# Patient Record
Sex: Female | Born: 1950 | Race: White | Hispanic: No | Marital: Married | Smoking: Never smoker
Health system: Southern US, Community
[De-identification: ages and names within clinical notes are randomized; demographics above are authoritative.]

## PROBLEM LIST (undated history)

## (undated) DIAGNOSIS — N393 Stress incontinence (female) (male): Secondary | ICD-10-CM

## (undated) DIAGNOSIS — K219 Gastro-esophageal reflux disease without esophagitis: Secondary | ICD-10-CM

## (undated) DIAGNOSIS — M51369 Other intervertebral disc degeneration, lumbar region without mention of lumbar back pain or lower extremity pain: Secondary | ICD-10-CM

## (undated) DIAGNOSIS — M5136 Other intervertebral disc degeneration, lumbar region: Secondary | ICD-10-CM

## (undated) DIAGNOSIS — R102 Pelvic and perineal pain unspecified side: Secondary | ICD-10-CM

## (undated) DIAGNOSIS — J3089 Other allergic rhinitis: Secondary | ICD-10-CM

## (undated) DIAGNOSIS — IMO0002 Reserved for concepts with insufficient information to code with codable children: Secondary | ICD-10-CM

## (undated) DIAGNOSIS — J309 Allergic rhinitis, unspecified: Secondary | ICD-10-CM

---

## 1999-06-20 ENCOUNTER — Ambulatory Visit (HOSPITAL_COMMUNITY): Admission: RE | Admit: 1999-06-20 | Discharge: 1999-06-20 | Payer: Self-pay | Admitting: Gastroenterology

## 1999-11-21 ENCOUNTER — Encounter: Payer: Self-pay | Admitting: Gynecology

## 1999-11-21 ENCOUNTER — Other Ambulatory Visit: Admission: RE | Admit: 1999-11-21 | Discharge: 1999-11-21 | Payer: Self-pay | Admitting: Gynecology

## 1999-11-21 ENCOUNTER — Encounter: Admission: RE | Admit: 1999-11-21 | Discharge: 1999-11-21 | Payer: Self-pay | Admitting: Gynecology

## 2000-05-17 ENCOUNTER — Other Ambulatory Visit: Admission: RE | Admit: 2000-05-17 | Discharge: 2000-05-17 | Payer: Self-pay | Admitting: Gynecology

## 2000-05-17 ENCOUNTER — Encounter (INDEPENDENT_AMBULATORY_CARE_PROVIDER_SITE_OTHER): Payer: Self-pay

## 2000-11-21 ENCOUNTER — Encounter: Payer: Self-pay | Admitting: Gynecology

## 2000-11-21 ENCOUNTER — Other Ambulatory Visit: Admission: RE | Admit: 2000-11-21 | Discharge: 2000-11-21 | Payer: Self-pay | Admitting: Gynecology

## 2000-11-21 ENCOUNTER — Encounter: Admission: RE | Admit: 2000-11-21 | Discharge: 2000-11-21 | Payer: Self-pay | Admitting: Gynecology

## 2000-12-02 ENCOUNTER — Encounter: Payer: Self-pay | Admitting: Gynecology

## 2000-12-02 ENCOUNTER — Encounter: Admission: RE | Admit: 2000-12-02 | Discharge: 2000-12-02 | Payer: Self-pay | Admitting: Gynecology

## 2001-12-31 ENCOUNTER — Other Ambulatory Visit: Admission: RE | Admit: 2001-12-31 | Discharge: 2001-12-31 | Payer: Self-pay | Admitting: Gynecology

## 2002-12-31 ENCOUNTER — Encounter: Payer: Self-pay | Admitting: Gynecology

## 2002-12-31 ENCOUNTER — Encounter: Admission: RE | Admit: 2002-12-31 | Discharge: 2002-12-31 | Payer: Self-pay | Admitting: Gynecology

## 2003-01-05 ENCOUNTER — Other Ambulatory Visit: Admission: RE | Admit: 2003-01-05 | Discharge: 2003-01-05 | Payer: Self-pay | Admitting: Gynecology

## 2004-01-05 ENCOUNTER — Encounter: Admission: RE | Admit: 2004-01-05 | Discharge: 2004-01-05 | Payer: Self-pay | Admitting: Gynecology

## 2004-01-06 ENCOUNTER — Other Ambulatory Visit: Admission: RE | Admit: 2004-01-06 | Discharge: 2004-01-06 | Payer: Self-pay | Admitting: Gynecology

## 2005-01-16 ENCOUNTER — Other Ambulatory Visit: Admission: RE | Admit: 2005-01-16 | Discharge: 2005-01-16 | Payer: Self-pay | Admitting: Gynecology

## 2005-01-30 ENCOUNTER — Encounter: Admission: RE | Admit: 2005-01-30 | Discharge: 2005-01-30 | Payer: Self-pay | Admitting: Gynecology

## 2005-02-07 ENCOUNTER — Encounter: Admission: RE | Admit: 2005-02-07 | Discharge: 2005-02-07 | Payer: Self-pay | Admitting: Gynecology

## 2005-10-04 ENCOUNTER — Ambulatory Visit: Payer: Self-pay | Admitting: Internal Medicine

## 2006-01-21 ENCOUNTER — Other Ambulatory Visit: Admission: RE | Admit: 2006-01-21 | Discharge: 2006-01-21 | Payer: Self-pay | Admitting: Gynecology

## 2006-05-28 ENCOUNTER — Ambulatory Visit: Payer: Self-pay | Admitting: Gastroenterology

## 2006-06-05 ENCOUNTER — Ambulatory Visit: Payer: Self-pay | Admitting: Gastroenterology

## 2006-11-14 ENCOUNTER — Encounter: Admission: RE | Admit: 2006-11-14 | Discharge: 2006-11-14 | Payer: Self-pay | Admitting: Gynecology

## 2007-01-27 ENCOUNTER — Other Ambulatory Visit: Admission: RE | Admit: 2007-01-27 | Discharge: 2007-01-27 | Payer: Self-pay | Admitting: Gynecology

## 2008-02-19 ENCOUNTER — Encounter: Admission: RE | Admit: 2008-02-19 | Discharge: 2008-02-19 | Payer: Self-pay | Admitting: Gynecology

## 2009-01-28 ENCOUNTER — Encounter (INDEPENDENT_AMBULATORY_CARE_PROVIDER_SITE_OTHER): Payer: Self-pay | Admitting: Urology

## 2009-01-28 ENCOUNTER — Ambulatory Visit (HOSPITAL_BASED_OUTPATIENT_CLINIC_OR_DEPARTMENT_OTHER): Admission: RE | Admit: 2009-01-28 | Discharge: 2009-01-28 | Payer: Self-pay | Admitting: Urology

## 2009-01-28 HISTORY — PX: OTHER SURGICAL HISTORY: SHX169

## 2009-04-07 ENCOUNTER — Encounter: Admission: RE | Admit: 2009-04-07 | Discharge: 2009-04-07 | Payer: Self-pay | Admitting: Gynecology

## 2010-06-29 ENCOUNTER — Encounter
Admission: RE | Admit: 2010-06-29 | Discharge: 2010-06-29 | Payer: Self-pay | Source: Home / Self Care | Attending: Gynecology | Admitting: Gynecology

## 2010-07-23 ENCOUNTER — Encounter: Payer: Self-pay | Admitting: Gynecology

## 2010-10-08 LAB — COMPREHENSIVE METABOLIC PANEL
Albumin: 3.5 g/dL (ref 3.5–5.2)
CO2: 29 mEq/L (ref 19–32)
Calcium: 8.7 mg/dL (ref 8.4–10.5)
Glucose, Bld: 99 mg/dL (ref 70–99)
Potassium: 4 mEq/L (ref 3.5–5.1)
Sodium: 140 mEq/L (ref 135–145)
Total Bilirubin: 0.5 mg/dL (ref 0.3–1.2)
Total Protein: 6.7 g/dL (ref 6.0–8.3)

## 2010-10-08 LAB — CBC
HCT: 39.9 % (ref 36.0–46.0)
Hemoglobin: 13.4 g/dL (ref 12.0–15.0)
RDW: 13.3 % (ref 11.5–15.5)
WBC: 5.9 10*3/uL (ref 4.0–10.5)

## 2010-10-08 LAB — DIFFERENTIAL
Basophils Absolute: 0 10*3/uL (ref 0.0–0.1)
Lymphocytes Relative: 26 % (ref 12–46)
Neutrophils Relative %: 61 % (ref 43–77)

## 2010-11-14 NOTE — Op Note (Signed)
[name], [name]             ACCOUNT NO.:  [id number]   MEDICAL RECORD NO.:  [id number]          PATIENT TYPE:  AMB   LOCATION:  [facility]                         FACILITY:  [facility]   PHYSICIAN:  Ronald L. Earlene Plater, M.D.  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   OPERATIVE PROCEDURE:  Cystourethroscopy, urethral dilatation, hydraulic  bladder distention and bladder biopsy.   SURGEON:  Gaynelle Arabian, M.D.   ANESTHESIA:  LMA.   ESTIMATED BLOOD LOSS:  Negligible.   TUBES:  None.   COMPLICATIONS:  None.   FINDINGS:  A 350 mL capacity bladder with urethral leakage at this  point.  80 cm water pressure was not reached and there were post-  distention glomerulations.   INDICATION FOR PROCEDURE:  Ms. Amy Barton is a lovely 60 year old white  female who presents with bladder pain, urgency and frequency.  She has  not been able to tolerate medications and was noted to have a very tight  urethra.  She, after understanding risks, benefits and alternatives,  elected to undergo the above procedure for both therapeutic and  diagnostic reasons.   PROCEDURE IN DETAIL:  The patient is placed in supine position.  After  proper LMA anesthesia, was placed in the dorsal lithotomy position,  prepped and draped with Betadine in a sterile fashion.  Urethra was  calibrated to 30-French with female sounds.  Cystourethroscopy was  performed.  The bladder was carefully inspected and noted to be without  lesions.  Efflux of clear urine was noted from the normally-placed  ureteral orifices bilaterally.  Hydraulic bladder distention was then  performed at a pressure of 80 cm of water.  She had a 350 mL capacity  and sterile water was leaking around the cystoscope at that point,  signifying total capacity, but 80 cm water pressure was never reached.  The  bladder was then drained and reinspection revealed multiple diffuse  glomerulations and some bleeding  points.  Biopsies were taken from the posterior midline with cold cup  biopsy forceps, submitted to pathology, and the base was cauterized with  Bovie coagulation cautery.  Reinspection revealed there were no further  lesions.  Specimens was submitted to pathology.  The patient was taken  to the recovery room, stable.      Ronald L. Earlene Plater, M.D.  Electronically Signed     RLD/MEDQ  D:  01/28/2009  T:  01/28/2009  Job:  [id number]

## 2011-03-19 ENCOUNTER — Other Ambulatory Visit: Payer: Self-pay | Admitting: Gynecology

## 2011-08-07 ENCOUNTER — Other Ambulatory Visit: Payer: Self-pay | Admitting: Gynecology

## 2011-08-07 DIAGNOSIS — Z1231 Encounter for screening mammogram for malignant neoplasm of breast: Secondary | ICD-10-CM

## 2011-08-23 ENCOUNTER — Ambulatory Visit
Admission: RE | Admit: 2011-08-23 | Discharge: 2011-08-23 | Disposition: A | Discharge status: Home / Self Care | Payer: 59 | Source: Ambulatory Visit | Attending: Gynecology | Admitting: Gynecology

## 2011-08-23 DIAGNOSIS — Z1231 Encounter for screening mammogram for malignant neoplasm of breast: Secondary | ICD-10-CM

## 2012-02-22 ENCOUNTER — Ambulatory Visit: Payer: Self-pay | Admitting: Gastroenterology

## 2012-08-05 ENCOUNTER — Other Ambulatory Visit: Payer: Self-pay | Admitting: Gynecology

## 2012-10-08 ENCOUNTER — Other Ambulatory Visit: Payer: Self-pay

## 2012-10-08 DIAGNOSIS — Z1231 Encounter for screening mammogram for malignant neoplasm of breast: Secondary | ICD-10-CM

## 2012-11-11 ENCOUNTER — Ambulatory Visit
Admission: RE | Admit: 2012-11-11 | Discharge: 2012-11-11 | Disposition: A | Discharge status: Home / Self Care | Payer: 59 | Source: Ambulatory Visit

## 2012-11-11 DIAGNOSIS — Z1231 Encounter for screening mammogram for malignant neoplasm of breast: Secondary | ICD-10-CM

## 2013-05-22 ENCOUNTER — Emergency Department: Payer: Self-pay | Admitting: Emergency Medicine

## 2013-05-22 LAB — CBC
HGB: 13.3 g/dL (ref 12.0–16.0)
MCV: 84 fL (ref 80–100)
RBC: 4.76 10*6/uL (ref 3.80–5.20)
RDW: 13.7 % (ref 11.5–14.5)

## 2013-05-22 LAB — BASIC METABOLIC PANEL
BUN: 15 mg/dL (ref 7–18)
Chloride: 107 mmol/L (ref 98–107)
Co2: 28 mmol/L (ref 21–32)
Creatinine: 0.83 mg/dL (ref 0.60–1.30)
EGFR (African American): >60
Glucose: 113 mg/dL — ABNORMAL HIGH (ref 65–99)
Osmolality: 281 (ref 275–301)
Potassium: 3.5 mmol/L (ref 3.5–5.1)

## 2013-05-22 LAB — TROPONIN I: Troponin-I: <0.02 ng/mL

## 2013-06-15 ENCOUNTER — Ambulatory Visit: Payer: Self-pay | Admitting: Gastroenterology

## 2013-06-16 LAB — PATHOLOGY REPORT

## 2014-11-25 ENCOUNTER — Other Ambulatory Visit: Payer: Self-pay | Admitting: Urology

## 2014-12-02 ENCOUNTER — Encounter (HOSPITAL_BASED_OUTPATIENT_CLINIC_OR_DEPARTMENT_OTHER): Payer: Self-pay | Admitting: *Deleted

## 2014-12-02 NOTE — Progress Notes (Signed)
NPO AFTER MN.  ARRIVE AT 0600.  NEEDS HG.  WILL TAKE PROTONIX AM DOS W/ SIPS OF WATER.

## 2014-12-08 NOTE — H&P (Signed)
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 She was switched on VESIcare to Toviaz for urgency and frequency last time because of cost. She has responded to rescue treatments in the past.     I saw Amy Barton in May, and I renewed her Elmiron, Toviaz, and Uribel. The Gala Murdochoviaz did not work as well as Assurantthe VESIcare, but it was a Futures traderfinancial decision. She saw Diane 3 times since and had a rescue treatment on 10/20/2014 with no significant benefit.   A staff tried to pass a catheter but she had too much pain and the meatus was strictured. Diane attempted a 14-French straight catheter and was able to insert it with a lot of resistance, and she had another cocktail treatment. Diane did not think she could tolerate an office dilation.   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 The urethra was calibrated to 30-French, and she was hydrodistended to 350 mL. She had multiple diffuse glomerulations with some bleeding.   Review of Systems: No change of bowel or neurologic systems.   Urinalysis: I reviewed. Negative.  Frequency is stable.    Past Medical History Problems  1. History of chronic cystitis (Z87.448) 2. History of depression (Z86.59) 3. History of esophageal reflux (Z87.19)  Surgical History Problems  1. History of Cesarean Section 2. History of Cystoscopy With Biopsy 3. History of Cystoscopy With Dilation Of Bladder  Current Meds 1. ALPRAZolam 0.25 MG Oral Tablet;  Therapy: 12Jul2012 to Recorded 2. Ambien CR 12.5 MG Oral Tablet Extended Release;  Therapy: 28Jun2011 to Recorded 3. Benadryl TABS;  Therapy: (Recorded:03Aug2011) to Recorded 4. CeleBREX 200 MG Oral Capsule;  Therapy: 19Apr2012 to Recorded 5. Citalopram Hydrobromide 20 MG Oral Tablet;  Therapy: 12Dec2014 to Recorded 6. Dexilant 60 MG Oral Capsule Delayed Release;  Therapy: 12Dec2014 to Recorded 7. Elmiron 100 MG Oral Capsule;  TAKE 1 CAPSULE 3 times daily;  Therapy: 26Mar2010 to (Last Rx:01Jul2015)  Requested for: 03Jul2015 Ordered 8. Estrace 0.1 MG/GM Vaginal Cream;  Therapy: (Recorded:03Aug2011) to Recorded 9. Flonase 50 MCG/ACT Nasal Suspension;  Therapy: (Recorded:05Dec2008) to Recorded 10. Singulair 10 MG Oral Tablet;   Therapy: (Recorded:26Dec2012) to Recorded 11. SUMAtriptan Succinate 100 MG Oral Tablet;   Therapy: (Recorded:08May2013) to Recorded 12. Toviaz 4 MG Oral Tablet Extended Release 24 Hour; Take 1 tablet daily;   Therapy: 11May2015 to (Last Rx:11May2015)  Requested for: 11May2015 Ordered 13. Uribel 118 MG Oral Capsule; TAKE 1 CAPSULE 4 times daily PRN;   Therapy: 19Feb2014 to (Last Rx:07Apr2016)  Requested for: 11Apr2016 Ordered 14. Vitamin D TABS;   Therapy: (Recorded:03Aug2011) to Recorded 15. Vivelle 0.05 MG/24HR PTTW;   Therapy: (Recorded:03Aug2011) to Recorded  Allergies Medication  1. Claritin TABS 2. Claritin-D  Family History Problems  1. Family history of Family Health Status Number Of Children   2 sons 2. Family history of Renal Failure : Father 3. Family history of Renal Failure : Brother  Social History Problems  1. Activities Of Daily Living 2. Alcohol Use 3. Being A Social Drinker 4. Caffeine Use   1-2 drinks daily 5. Exercise Habits   walking the dog and no other reg exer. 6. Living Independently With Spouse 7. Marital History - Currently Married 8. Never A Smoker 9. Occupation:   Airline pilotaccountant 10. Self-reliant In Usual Daily Activities  Vitals Vital Signs [Data Includes: Last 1 Day]  Recorded: 24May2016 09:24AM  Blood Pressure: 159 / 100 Temperature: 98.5 F Heart  Rate: 97  Results/Data  Urine [Data Includes: Last 1 Day]   24May2016  COLOR YELLOW   APPEARANCE CLEAR   SPECIFIC GRAVITY <1.005   pH 5.5   GLUCOSE NEG mg/dL  BILIRUBIN NEG   KETONE NEG mg/dL  BLOOD NEG   PROTEIN NEG mg/dL  UROBILINOGEN 0.2 mg/dL  NITRITE NEG   LEUKOCYTE ESTERASE  NEG    Assessment Assessed  1. Increased urinary frequency (R35.0) 2. Chronic interstitial cystitis without hematuria (N30.10)  Plan Chronic interstitial cystitis without hematuria  1. Follow-up Schedule Surgery Office  Follow-up  Status: Complete  Done: 24May2016  Discussion/Summary   Amy Fodera had a hard time keeping the last instillation from when she stood up. She thinks it has helped some, but she still has some suprapubic discomfort. She thinks this flare is from the springtime, and she may be right.  I talked to her about her hydrodistention and urethral dilation and its pros and cons and risks.   We talked about cystoscopy/hydrodistention and instillation in detail. Pros, cons, general surgical and anesthetic risks, and other options including watchful waiting were discussed. Risks were described but not limited to pain, infection, and bleeding. The risk of bladder perforation and management were discussed. The patient understands that it is primarily a diagnostic procedure.   She understands that she does not need the procedure for certain, but we are hoping it would help in the catheterization process in the future for rescue treatments. I would also evaluate her strictly for urethral stenosis under anesthesia. She consented.   In regard to the symptoms, there are no other modifying factors or associated signs or symptoms. There are no other aggravating or relieving factors. The presentation is moderate in severity and ongoing.  After a thorough review of the management options for the patient's condition the patient  elected to proceed with surgical therapy as noted above. We have discussed the potential benefits and risks of the procedure, side effects of the proposed treatment, the likelihood of the patient achieving the goals of the procedure, and any potential problems that might occur during the procedure or recuperation. Informed consent has been obtained.

## 2014-12-08 NOTE — Anesthesia Preprocedure Evaluation (Signed)
Anesthesia Evaluation  Patient identified by MRN, date of birth, ID band Patient awake    Reviewed: Allergy & Precautions, NPO status , Patient's Chart, lab work & pertinent test results  Airway Mallampati: II  TM Distance: >3 FB Neck ROM: Full    Dental no notable dental hx.    Pulmonary neg pulmonary ROS,  breath sounds clear to auscultation  Pulmonary exam normal       Cardiovascular negative cardio ROS Normal cardiovascular examRhythm:Regular Rate:Normal     Neuro/Psych negative neurological ROS  negative psych ROS   GI/Hepatic negative GI ROS, Neg liver ROS, GERD-  ,  Endo/Other  negative endocrine ROS  Renal/GU negative Renal ROS     Musculoskeletal negative musculoskeletal ROS (+) Arthritis -,   Abdominal   Peds  Hematology negative hematology ROS (+)   Anesthesia Other Findings   Reproductive/Obstetrics negative OB ROS                             Anesthesia Physical Anesthesia Plan  ASA: II  Anesthesia Plan: General   Post-op Pain Management:    Induction: Intravenous  Airway Management Planned: LMA  Additional Equipment:   Intra-op Plan:   Post-operative Plan: Extubation in OR  Informed Consent: I have reviewed the patients History and Physical, chart, labs and discussed the procedure including the risks, benefits and alternatives for the proposed anesthesia with the patient or authorized representative who has indicated his/her understanding and acceptance.   Dental advisory given  Plan Discussed with: CRNA  Anesthesia Plan Comments:         Anesthesia Quick Evaluation

## 2014-12-09 ENCOUNTER — Ambulatory Visit (HOSPITAL_BASED_OUTPATIENT_CLINIC_OR_DEPARTMENT_OTHER): Payer: 59 | Admitting: Anesthesiology

## 2014-12-09 ENCOUNTER — Encounter (HOSPITAL_BASED_OUTPATIENT_CLINIC_OR_DEPARTMENT_OTHER)
Admission: RE | Disposition: A | Discharge status: Home / Self Care | Payer: Self-pay | Source: Ambulatory Visit | Attending: Urology

## 2014-12-09 ENCOUNTER — Other Ambulatory Visit: Payer: Self-pay

## 2014-12-09 ENCOUNTER — Ambulatory Visit (HOSPITAL_BASED_OUTPATIENT_CLINIC_OR_DEPARTMENT_OTHER)
Admission: RE | Admit: 2014-12-09 | Discharge: 2014-12-09 | Disposition: A | Discharge status: Home / Self Care | Payer: 59 | Source: Ambulatory Visit | Attending: Urology | Admitting: Urology

## 2014-12-09 ENCOUNTER — Encounter (HOSPITAL_BASED_OUTPATIENT_CLINIC_OR_DEPARTMENT_OTHER): Payer: Self-pay | Admitting: *Deleted

## 2014-12-09 DIAGNOSIS — N3010 Interstitial cystitis (chronic) without hematuria: Secondary | ICD-10-CM | POA: Insufficient documentation

## 2014-12-09 DIAGNOSIS — Z79890 Hormone replacement therapy (postmenopausal): Secondary | ICD-10-CM | POA: Diagnosis not present

## 2014-12-09 DIAGNOSIS — K219 Gastro-esophageal reflux disease without esophagitis: Secondary | ICD-10-CM | POA: Diagnosis not present

## 2014-12-09 DIAGNOSIS — F329 Major depressive disorder, single episode, unspecified: Secondary | ICD-10-CM | POA: Insufficient documentation

## 2014-12-09 DIAGNOSIS — Z79899 Other long term (current) drug therapy: Secondary | ICD-10-CM | POA: Diagnosis not present

## 2014-12-09 DIAGNOSIS — Z791 Long term (current) use of non-steroidal anti-inflammatories (NSAID): Secondary | ICD-10-CM | POA: Diagnosis not present

## 2014-12-09 DIAGNOSIS — N359 Urethral stricture, unspecified: Secondary | ICD-10-CM | POA: Diagnosis not present

## 2014-12-09 DIAGNOSIS — R102 Pelvic and perineal pain: Secondary | ICD-10-CM | POA: Diagnosis present

## 2014-12-09 DIAGNOSIS — M1990 Unspecified osteoarthritis, unspecified site: Secondary | ICD-10-CM | POA: Insufficient documentation

## 2014-12-09 HISTORY — PX: CYSTO WITH HYDRODISTENSION: SHX5453

## 2014-12-09 HISTORY — DX: Gastro-esophageal reflux disease without esophagitis: K21.9

## 2014-12-09 HISTORY — PX: CYSTOSCOPY WITH URETHRAL DILATATION: SHX5125

## 2014-12-09 HISTORY — DX: Pelvic and perineal pain: R10.2

## 2014-12-09 HISTORY — DX: Pelvic and perineal pain unspecified side: R10.20

## 2014-12-09 HISTORY — DX: Other intervertebral disc degeneration, lumbar region without mention of lumbar back pain or lower extremity pain: M51.369

## 2014-12-09 HISTORY — DX: Other intervertebral disc degeneration, lumbar region: M51.36

## 2014-12-09 HISTORY — DX: Reserved for concepts with insufficient information to code with codable children: IMO0002

## 2014-12-09 HISTORY — DX: Other allergic rhinitis: J30.89

## 2014-12-09 HISTORY — DX: Allergic rhinitis, unspecified: J30.9

## 2014-12-09 HISTORY — DX: Stress incontinence (female) (male): N39.3

## 2014-12-09 LAB — POCT HEMOGLOBIN-HEMACUE: HEMOGLOBIN: 13.3 g/dL (ref 12.0–15.0)

## 2014-12-09 SURGERY — CYSTOSCOPY, WITH BLADDER HYDRODISTENSION
Anesthesia: General | Site: Urethra

## 2014-12-09 MED ORDER — FENTANYL CITRATE (PF) 100 MCG/2ML IJ SOLN
INTRAMUSCULAR | Status: DC | PRN
  Administered 2014-12-09: 25 ug via INTRAVENOUS

## 2014-12-09 MED ORDER — CIPROFLOXACIN IN D5W 400 MG/200ML IV SOLN
INTRAVENOUS | Status: AC
  Filled 2014-12-09: qty 200

## 2014-12-09 MED ORDER — MIDAZOLAM HCL 2 MG/2ML IJ SOLN
INTRAMUSCULAR | Status: AC
  Filled 2014-12-09: qty 2

## 2014-12-09 MED ORDER — HYDROMORPHONE HCL 1 MG/ML IJ SOLN
0.2500 mg | INTRAMUSCULAR | Status: DC | PRN
  Filled 2014-12-09: qty 1

## 2014-12-09 MED ORDER — BUPIVACAINE HCL (PF) 0.5 % IJ SOLN
INTRAMUSCULAR | Status: DC | PRN
  Administered 2014-12-09: 15 mL via INTRAVESICAL

## 2014-12-09 MED ORDER — CIPROFLOXACIN IN D5W 400 MG/200ML IV SOLN
400.0000 mg | INTRAVENOUS | Status: DC
  Filled 2014-12-09: qty 200

## 2014-12-09 MED ORDER — LIDOCAINE HCL (CARDIAC) 20 MG/ML IV SOLN
INTRAVENOUS | Status: DC | PRN
  Administered 2014-12-09: 50 mg via INTRAVENOUS

## 2014-12-09 MED ORDER — LACTATED RINGERS IV SOLN
INTRAVENOUS | Status: DC
  Administered 2014-12-09: 07:00:00 via INTRAVENOUS
  Filled 2014-12-09: qty 1000

## 2014-12-09 MED ORDER — OXYCODONE HCL 5 MG/5ML PO SOLN
5.0000 mg | Freq: Once | ORAL | Status: DC | PRN
  Filled 2014-12-09: qty 5

## 2014-12-09 MED ORDER — DEXAMETHASONE SODIUM PHOSPHATE 4 MG/ML IJ SOLN
INTRAMUSCULAR | Status: DC | PRN
  Administered 2014-12-09: 10 mg via INTRAVENOUS

## 2014-12-09 MED ORDER — STERILE WATER FOR IRRIGATION IR SOLN
Status: DC | PRN
  Administered 2014-12-09: 3000 mL

## 2014-12-09 MED ORDER — FENTANYL CITRATE (PF) 100 MCG/2ML IJ SOLN
INTRAMUSCULAR | Status: AC
  Filled 2014-12-09: qty 2

## 2014-12-09 MED ORDER — MEPERIDINE HCL 25 MG/ML IJ SOLN
6.2500 mg | INTRAMUSCULAR | Status: DC | PRN
  Filled 2014-12-09: qty 1

## 2014-12-09 MED ORDER — HYDROCODONE-ACETAMINOPHEN 5-325 MG PO TABS: 1.0000 | ORAL_TABLET | Freq: Four times a day (QID) | ORAL | Status: DC | PRN

## 2014-12-09 MED ORDER — ONDANSETRON HCL 4 MG/2ML IJ SOLN
INTRAMUSCULAR | Status: DC | PRN
  Administered 2014-12-09: 4 mg via INTRAVENOUS

## 2014-12-09 MED ORDER — PROMETHAZINE HCL 25 MG/ML IJ SOLN
6.2500 mg | INTRAMUSCULAR | Status: DC | PRN
  Filled 2014-12-09: qty 1

## 2014-12-09 MED ORDER — MIDAZOLAM HCL 5 MG/5ML IJ SOLN
INTRAMUSCULAR | Status: DC | PRN
  Administered 2014-12-09: 1 mg via INTRAVENOUS

## 2014-12-09 MED ORDER — OXYCODONE HCL 5 MG PO TABS
5.0000 mg | ORAL_TABLET | Freq: Once | ORAL | Status: DC | PRN
  Filled 2014-12-09: qty 1

## 2014-12-09 MED ORDER — PROPOFOL 10 MG/ML IV BOLUS
INTRAVENOUS | Status: DC | PRN
  Administered 2014-12-09: 150 mg via INTRAVENOUS

## 2014-12-09 MED ORDER — CIPROFLOXACIN HCL 250 MG PO TABS: 250.0000 mg | ORAL_TABLET | Freq: Two times a day (BID) | ORAL | Status: DC

## 2014-12-09 MED ORDER — KETOROLAC TROMETHAMINE 30 MG/ML IJ SOLN
INTRAMUSCULAR | Status: DC | PRN
  Administered 2014-12-09: 30 mg via INTRAVENOUS

## 2014-12-09 SURGICAL SUPPLY — 38 items
ADAPTER CATH URET PLST 4-6FR (CATHETERS) IMPLANT
ADPR CATH URET STRL DISP 4-6FR (CATHETERS)
BAG DRAIN URO-CYSTO SKYTR STRL (DRAIN) ×3 IMPLANT
BAG DRN UROCATH (DRAIN) ×1
BALLN NEPHROSTOMY (BALLOONS)
BALLOON NEPHROSTOMY (BALLOONS) IMPLANT
CANISTER SUCT LVC 12 LTR MEDI- (MISCELLANEOUS) ×2 IMPLANT
CATH FOLEY 2WAY SLVR  5CC 18FR (CATHETERS)
CATH FOLEY 2WAY SLVR 5CC 18FR (CATHETERS) IMPLANT
CATH ROBINSON RED A/P 12FR (CATHETERS) ×2 IMPLANT
CATH ROBINSON RED A/P 14FR (CATHETERS) ×1 IMPLANT
CATH URET 5FR 28IN CONE TIP (BALLOONS)
CATH URET 5FR 70CM CONE TIP (BALLOONS) IMPLANT
CLOTH BEACON ORANGE TIMEOUT ST (SAFETY) ×3 IMPLANT
ELECT REM PT RETURN 9FT ADLT (ELECTROSURGICAL)
ELECTRODE REM PT RTRN 9FT ADLT (ELECTROSURGICAL) IMPLANT
GLOVE BIO SURGEON STRL SZ 6.5 (GLOVE) ×1 IMPLANT
GLOVE BIO SURGEON STRL SZ7.5 (GLOVE) ×3 IMPLANT
GLOVE BIO SURGEONS STRL SZ 6.5 (GLOVE) ×1
GLOVE INDICATOR 6.5 STRL GRN (GLOVE) ×4 IMPLANT
GLOVE INDICATOR 7.0 STRL GRN (GLOVE) ×2 IMPLANT
GLOVE SURG SS PI 6.5 STRL IVOR (GLOVE) ×2 IMPLANT
GOWN STRL REUS W/ TWL LRG LVL3 (GOWN DISPOSABLE) ×1 IMPLANT
GOWN STRL REUS W/ TWL XL LVL3 (GOWN DISPOSABLE) ×1 IMPLANT
GOWN STRL REUS W/TWL LRG LVL3 (GOWN DISPOSABLE) ×9
GOWN STRL REUS W/TWL XL LVL3 (GOWN DISPOSABLE) ×3
GUIDEWIRE STR DUAL SENSOR (WIRE) ×1 IMPLANT
NDL HYPO 18GX1.5 BLUNT FILL (NEEDLE) IMPLANT
NDL SAFETY ECLIPSE 18X1.5 (NEEDLE) ×1 IMPLANT
NEEDLE HYPO 18GX1.5 BLUNT FILL (NEEDLE) ×3 IMPLANT
NEEDLE HYPO 18GX1.5 SHARP (NEEDLE) ×3
NS IRRIG 500ML POUR BTL (IV SOLUTION) IMPLANT
PACK CYSTO (CUSTOM PROCEDURE TRAY) ×3 IMPLANT
SUT SILK 0 TIES 10X30 (SUTURE) IMPLANT
SYR 20CC LL (SYRINGE) ×1 IMPLANT
SYR 30ML LL (SYRINGE) ×2 IMPLANT
SYRINGE IRR TOOMEY STRL 70CC (SYRINGE) IMPLANT
WATER STERILE IRR 3000ML UROMA (IV SOLUTION) ×3 IMPLANT

## 2014-12-09 NOTE — Transfer of Care (Addendum)
Immediate Anesthesia Transfer of Care Note  Patient: Amy Barton  Procedure(s) Performed: Procedure(s): CYSTO/HYDRODISTENSION/INSTALLATION (N/A) CYSTOSCOPY WITH URETHRAL DILATATION (N/A)  Patient Location: PACU  Anesthesia Type:General  Level of Consciousness: awake and oriented  Airway & Oxygen Therapy: Patient Spontanous Breathing and Patient connected to nasal cannula oxygen  Post-op Assessment: Report given to RN  Post vital signs: Reviewed and stable  Last Vitals:  Filed Vitals:   12/09/14 0631  BP: 141/83  Pulse: 71  Temp: 36.9 C  Resp: 14    Complications: adverse drug reaction, upon arrival to OR noted rash on back.  Cipro was infusing but pt stated that she has the rash on and off on back.  Cipro continued.  At end of cases noted rash on trunk and extremities, not on face.  Could be from Cipro or from chlorahexidine wipe.  Denies itching.

## 2014-12-09 NOTE — OR Nursing (Signed)
Red rubber cath#14 in and out for the pyridium mix by Dr. Sherron Monday. Patient with redness all over her body at the end of the procedure.

## 2014-12-09 NOTE — Interval H&P Note (Signed)
History and Physical Interval Note:  12/09/2014 6:53 AM  Amy Barton  has presented today for surgery, with the diagnosis of PELVIC PAIN/URETHRAL STENOSIS  The various methods of treatment have been discussed with the patient and family. After consideration of risks, benefits and other options for treatment, the patient has consented to  Procedure(s): CYSTO/HYDRODISTENSION/INSTALLATION (N/A) CYSTOSCOPY WITH URETHRAL DILATATION (N/A) as a surgical intervention .  The patient's history has been reviewed, patient examined, no change in status, stable for surgery.  I have reviewed the patient's chart and labs.  Questions were answered to the patient's satisfaction.     [name]

## 2014-12-09 NOTE — Anesthesia Procedure Notes (Signed)
Procedure Name: LMA Insertion Date/Time: 12/09/2014 7:31 AM Performed by: Maris Berger T Pre-anesthesia Checklist: Patient identified, Emergency Drugs available, Suction available and Patient being monitored Patient Re-evaluated:Patient Re-evaluated prior to inductionOxygen Delivery Method: Circle System Utilized Preoxygenation: Pre-oxygenation with 100% oxygen Intubation Type: IV induction Ventilation: Mask ventilation without difficulty LMA: LMA inserted LMA Size: 4.0 Number of attempts: 1 Airway Equipment and Method: Bite block Placement Confirmation: positive ETCO2 Dental Injury: Teeth and Oropharynx as per pre-operative assessment

## 2014-12-09 NOTE — Anesthesia Postprocedure Evaluation (Signed)
Anesthesia Post Note  Patient: Amy Barton  Procedure(s) Performed: Procedure(s) (LRB): CYSTO/HYDRODISTENSION/INSTALLATION (N/A) CYSTOSCOPY WITH URETHRAL DILATATION (N/A)  Anesthesia type: General  Patient location: PACU  Post pain: Pain level controlled  Post assessment: Post-op Vital signs reviewed  Last Vitals: BP 145/92 mmHg  Pulse 68  Temp(Src) 36.4 C (Oral)  Resp 14  Ht 5\' 2"  (1.575 m)  Wt 119 lb (53.978 kg)  BMI 21.76 kg/m2  SpO2 100%  Post vital signs: Reviewed  Level of consciousness: sedated  Complications: No apparent anesthesia complications

## 2014-12-09 NOTE — Op Note (Signed)
Preoperative diagnosis: Interstitial cystitis and urethral stricture Postoperative diagnosis: Interstitial cystitis and urethral stricture Surgery: Cystoscopy and bladder hydrodistention and bladder installation therapy and urethral dilation Surgeon: Dr. Lorin Picket Thressa Shiffer  The patient consented the above procedure with the above diagnoses. Preoperative antibodies were given  I initially used a 35 [place] scope and she did have a stricture approximately 1 cm proximal to the urethral meatus or closer to the meatus. With a little bit a negotiation I was able to push through the stricture easily.  She was hydrodistended to 380 mL twice. Her bladder was emptied. She diffuse glomerulations and no ulcers. Trigone was normal.  I reinspected the urethra and she had typical findings in keeping with the dilator stricture distally and she may have had some scarring at the bladder neck. Urethra was reasonably long and quite supple. Using female sounds it was easily dilated from 17-32 [place]. The urethra itself was not stenotic except for the 1 or 2 webs dilated in my opinion  As a separate procedure I instilled 25 mL of 0.5% Marcaine and 400 mg of peridium  Patient will be treated as per protocol

## 2014-12-09 NOTE — Discharge Instructions (Signed)
I have reviewed discharge instructions in detail with the patient. They will follow-up with me or their physician as scheduled. My nurse will also be calling the patients as per protocol.    CYSTOSCOPY HOME CARE INSTRUCTIONS  Activity: Rest for the remainder of the day.  Do not drive or operate equipment today.  You may resume normal activities in one to two days as instructed by your physician.   Meals: Drink plenty of liquids and eat light foods such as gelatin or soup this evening.  You may return to a normal meal plan tomorrow.  Return to Work: You may return to work in one to two days or as instructed by your physician.  Special Instructions / Symptoms: Call your physician if any of these symptoms occur:   -persistent or heavy bleeding  -bleeding which continues after first few urination  -large blood clots that are difficult to pass  -urine stream diminishes or stops completely  -fever equal to or higher than 101 degrees Farenheit.  -cloudy urine with a strong, foul odor  -severe pain  Females should always wipe from front to back after elimination.  You may feel some burning pain when you urinate.  This should disappear with time.  Applying moist heat to the lower abdomen or a hot tub bath may help relieve the pain. \     Post Anesthesia Home Care Instructions  Activity: Get plenty of rest for the remainder of the day. A responsible adult should stay with you for 24 hours following the procedure.  For the next 24 hours, DO NOT: -Drive a car -Advertising copywriter -Drink alcoholic beverages -Take any medication unless instructed by your physician -Make any legal decisions or sign important papers.  Meals: Start with liquid foods such as gelatin or soup. Progress to regular foods as tolerated. Avoid greasy, spicy, heavy foods. If nausea and/or vomiting occur, drink only clear liquids until the nausea and/or vomiting subsides. Call your physician if vomiting  continues.  Special Instructions/Symptoms: Your throat may feel dry or sore from the anesthesia or the breathing tube placed in your throat during surgery. If this causes discomfort, gargle with warm salt water. The discomfort should disappear within 24 hours.  If you had a scopolamine patch placed behind your ear for the management of post- operative nausea and/or vomiting:  1. The medication in the patch is effective for 72 hours, after which it should be removed.  Wrap patch in a tissue and discard in the trash. Wash hands thoroughly with soap and water. 2. You may remove the patch earlier than 72 hours if you experience unpleasant side effects which may include dry mouth, dizziness or visual disturbances. 3. Avoid touching the patch. Wash your hands with soap and water after contact with the patch.

## 2014-12-10 ENCOUNTER — Encounter (HOSPITAL_BASED_OUTPATIENT_CLINIC_OR_DEPARTMENT_OTHER): Payer: Self-pay | Admitting: Urology

## 2014-12-10 NOTE — Progress Notes (Signed)
MS,Sarris CALLED STATING THAT HER FACE HAS BROKEN OUT.  SHE WAS INSTRUCTED TO CONTACT DR.MACDIARMID AT THE OFFICE AND IF HE WAS NOT THERE ASK TO SPEAK WITH HIS NURSE.  IF UNABLE TO REACH EITHER ASK TO SPEAK WITH THE ON CALL DOCTOR.

## 2015-07-07 ENCOUNTER — Ambulatory Visit (INDEPENDENT_AMBULATORY_CARE_PROVIDER_SITE_OTHER): Payer: Medicare Other | Admitting: Pediatrics

## 2015-07-07 ENCOUNTER — Encounter: Payer: Self-pay | Admitting: Pediatrics

## 2015-07-07 VITALS — BP 118/76 | HR 78 | Temp 98.0°F | Resp 16 | Ht 60.63 in | Wt 119.9 lb

## 2015-07-07 DIAGNOSIS — K219 Gastro-esophageal reflux disease without esophagitis: Secondary | ICD-10-CM

## 2015-07-07 DIAGNOSIS — J3089 Other allergic rhinitis: Secondary | ICD-10-CM

## 2015-07-07 DIAGNOSIS — J4520 Mild intermittent asthma, uncomplicated: Secondary | ICD-10-CM | POA: Diagnosis not present

## 2015-07-07 MED ORDER — FLUTICASONE PROPIONATE 50 MCG/ACT NA SUSP: 1.0000 | Freq: Two times a day (BID) | NASAL | Status: AC

## 2015-07-07 NOTE — Patient Instructions (Signed)
Cetirizine 10 mg once a day Fluticasone 2 sprays per nostril once a day if needed for stuffy nose Ventolin 2 puffs every 4 hours if needed for wheezing or coughing spells Continue other medications

## 2015-07-07 NOTE — Progress Notes (Signed)
  [address] [place] [address] Dept: [phone]  FOLLOW UP NOTE  Patient ID: Amy Barton, female    DOB: 08/09/1950  Age: 65 y.o. MRN: [id number] Date of Office Visit: 07/07/2015  Assessment Chief Complaint: Allergic Rhinitis   HPI Amy Clevelandancy A Padmore presents for follow-up of her asthma and allergic rhinitis. Her asthma has been well controlled and she very rarely has to use  Ventolin Her nasal symptoms are under control.  Current medications are cetirizine 10 mg once a day if needed and fluticasone 2 sprays per nostril once a day if needed and pantoprazole 40 mg once a day. Her other medications are outlined in the chart   Drug Allergies:  Allergies  Allergen Reactions  . Allegra-D  [Fexofenadine-Pseudoephed Er] Nausea And Vomiting  . Claritin-D 12 Hour [Loratadine-Pseudoephedrine Er] Other (See Comments)    "DIZZINESS"  . Chlorhexidine Gluconate Rash    Rash after using wipes pre operatively     Physical Exam: BP 118/76 mmHg  Pulse 78  Temp(Src) 98 F (36.7 C) (Oral)  Resp 16  Ht 5' 0.63" (1.54 m)  Wt 119 lb 14.9 oz (54.4 kg)  BMI 22.94 kg/m2   Physical Exam  Constitutional: She is oriented to person, place, and time. She appears well-developed and well-nourished.  HENT:  Eyes normal. Ears normal. Nose normal. Pharynx normal.  Neck: Neck supple.  Cardiovascular:  S1 and S2 normal no murmurs  Pulmonary/Chest:  Clear to percussion and auscultation  Lymphadenopathy:    She has no cervical adenopathy.  Neurological: She is alert and oriented to person, place, and time.  Psychiatric: She has a normal mood and affect. Her behavior is normal. Judgment and thought content normal.  Vitals reviewed.   Diagnostics:   FVC 2.93 L FEV1 2.14 L. Predicted FVC 2.80 L predicted FEV1 2.14 L-spirometry in the normal range  Assessment and Plan: 1. Mild intermittent asthma, uncomplicated   2. Other allergic rhinitis   3. Gastroesophageal reflux disease  without esophagitis     Meds ordered this encounter  Medications  . fluticasone (FLONASE) 50 MCG/ACT nasal spray    Sig: Place 1 spray into both nostrils 2 (two) times daily.    Dispense:  16 g    Refill:  5    Patient Instructions  Cetirizine 10 mg once a day Fluticasone 2 sprays per nostril once a day if needed for stuffy nose Ventolin 2 puffs every 4 hours if needed for wheezing or coughing spells Continue other medications    Return in about 1 year (around 07/06/2016).    Thank you for the opportunity to care for this patient.  Please do not hesitate to contact me with questions.  Tonette BihariJ. A. Bardelas, M.D.  [facility] [address] [place], [place] [address] [phone]

## 2015-12-01 ENCOUNTER — Other Ambulatory Visit: Payer: Self-pay | Admitting: Gynecology

## 2015-12-01 DIAGNOSIS — R928 Other abnormal and inconclusive findings on diagnostic imaging of breast: Secondary | ICD-10-CM

## 2015-12-07 ENCOUNTER — Ambulatory Visit
Admission: RE | Admit: 2015-12-07 | Discharge: 2015-12-07 | Disposition: A | Discharge status: Home / Self Care | Payer: Medicare Other | Source: Ambulatory Visit | Attending: Gynecology | Admitting: Gynecology

## 2015-12-07 DIAGNOSIS — R928 Other abnormal and inconclusive findings on diagnostic imaging of breast: Secondary | ICD-10-CM

## 2016-04-24 ENCOUNTER — Other Ambulatory Visit: Payer: Self-pay | Admitting: Gastroenterology

## 2016-04-24 DIAGNOSIS — R1013 Epigastric pain: Secondary | ICD-10-CM

## 2016-05-01 ENCOUNTER — Ambulatory Visit: Payer: Medicare Other

## 2016-05-08 ENCOUNTER — Ambulatory Visit
Admission: RE | Admit: 2016-05-08 | Discharge: 2016-05-08 | Disposition: A | Discharge status: Home / Self Care | Payer: Medicare Other | Source: Ambulatory Visit | Attending: Gastroenterology | Admitting: Gastroenterology

## 2016-05-08 DIAGNOSIS — R1013 Epigastric pain: Secondary | ICD-10-CM | POA: Insufficient documentation

## 2017-03-02 IMAGING — MG 2D DIGITAL DIAGNOSTIC UNILATERAL RIGHT MAMMOGRAM WITH CAD AND AD
5 series · 6 of 13 positions shown · non-contrast
Comparison: Previous exam(s).

CLINICAL DATA: Screening recall for a possible right breast mass.

EXAM:
2D DIGITAL DIAGNOSTIC UNILATERAL RIGHT MAMMOGRAM WITH CAD AND
ADJUNCT TOMO
RIGHT BREAST ULTRASOUND

[R ML synth-2D]
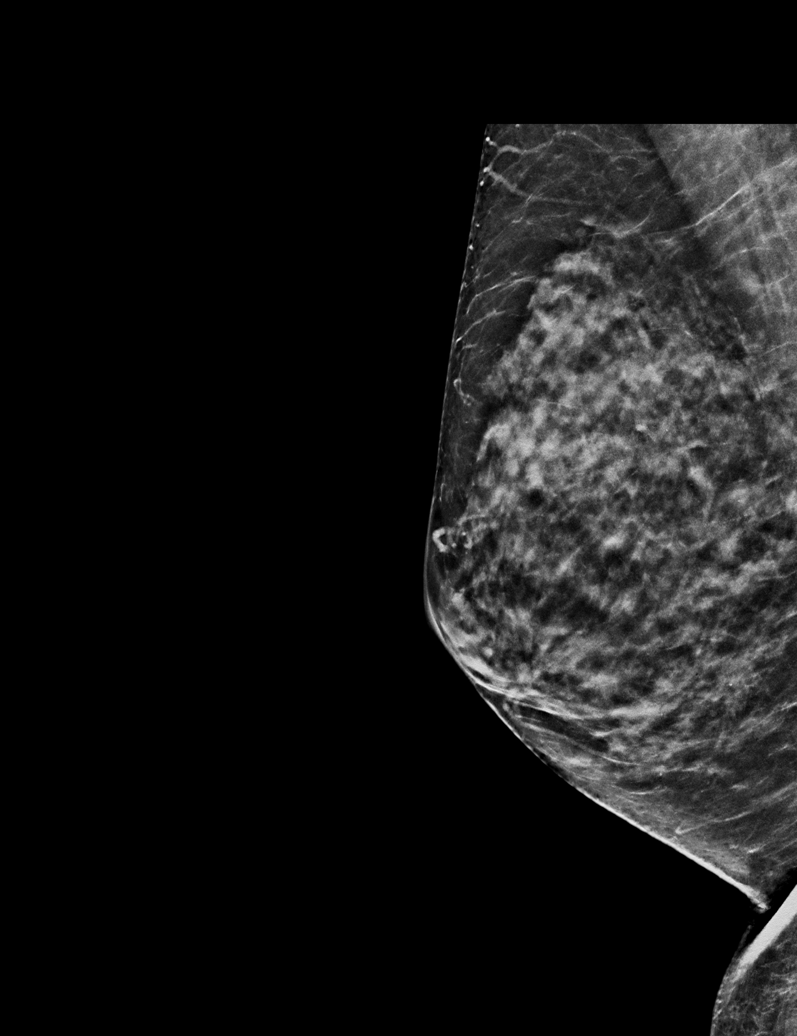

[R ML]
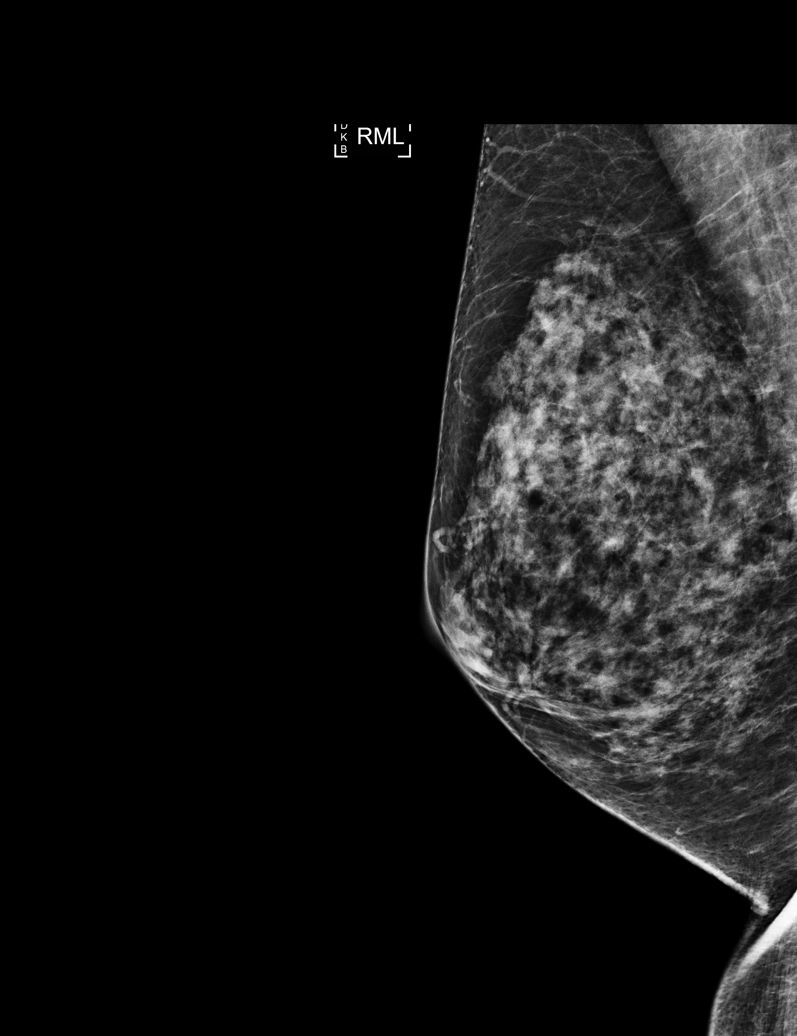

[R MLO]
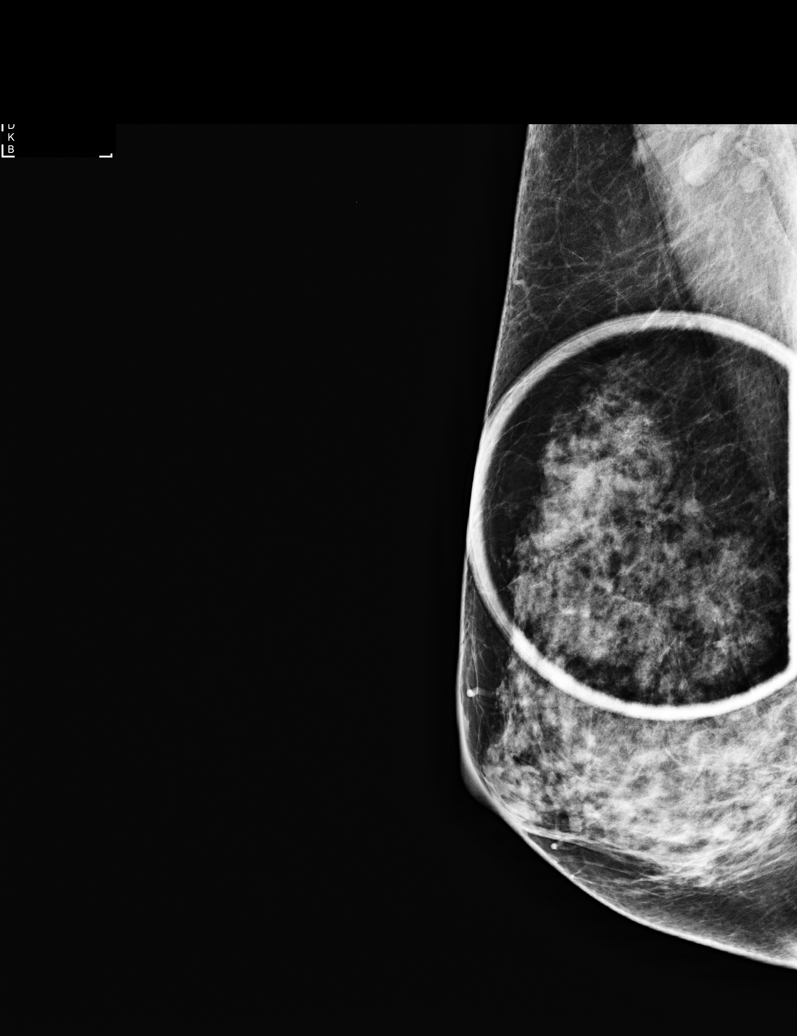

[R ML tomo · 2 of 50 frames shown]
[frame 17/50]
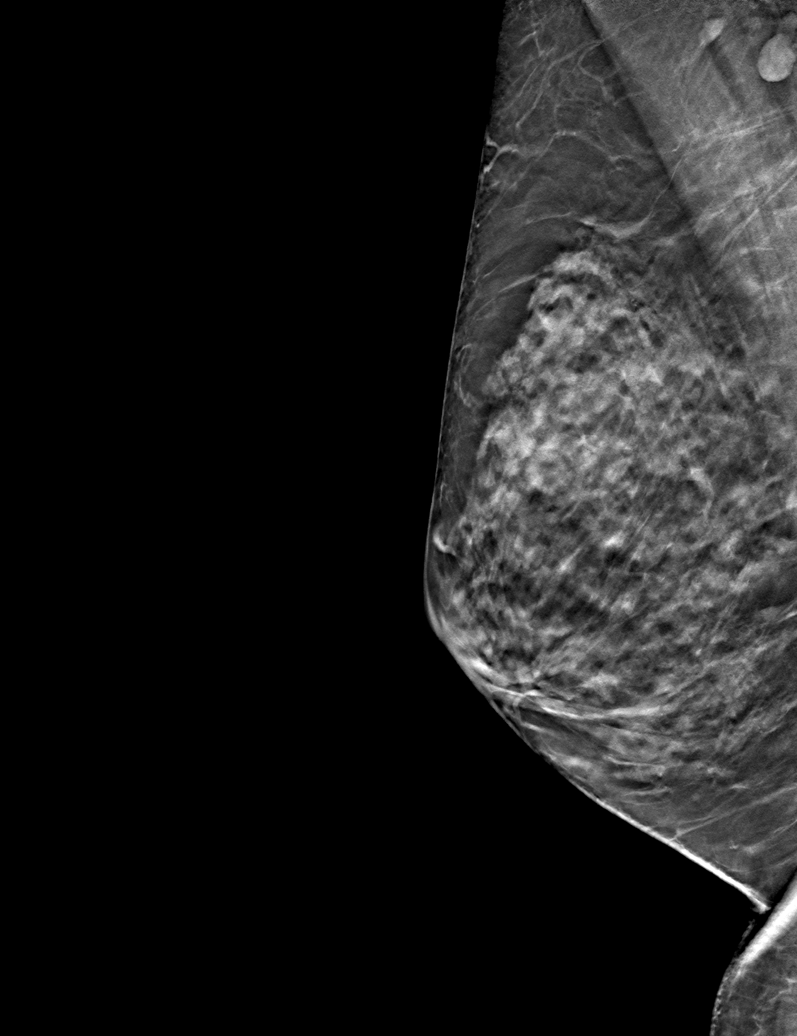
[frame 25/50]
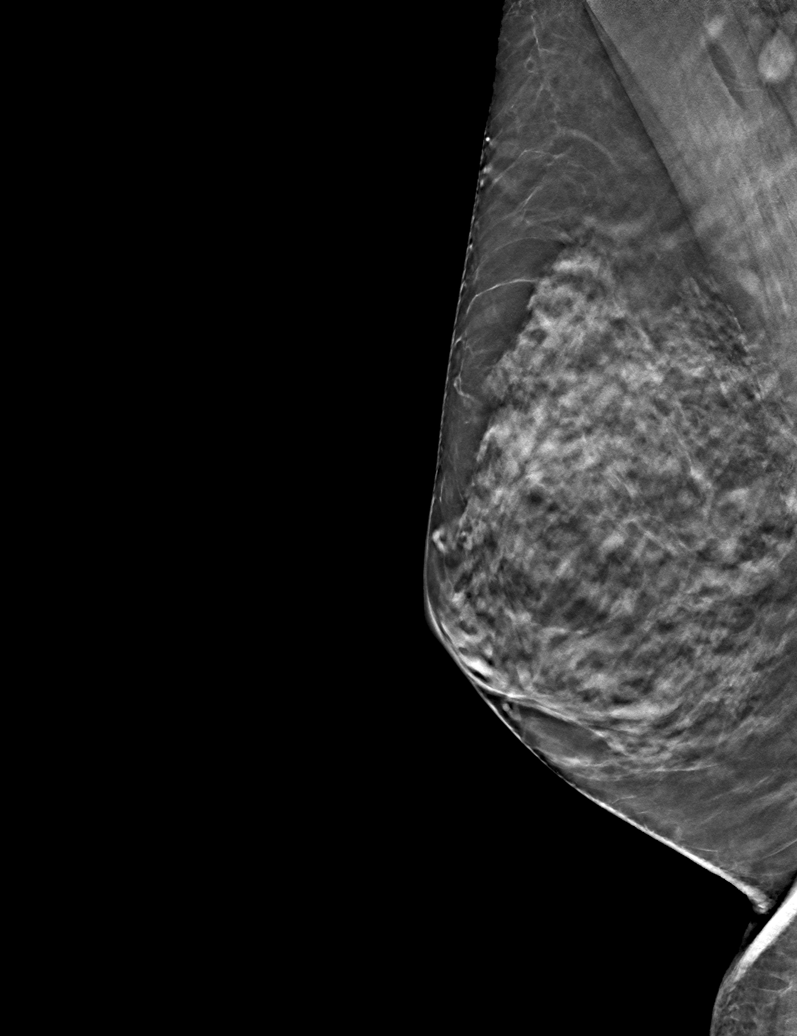

[R MLO tomo · tomo slice 26/51.0]
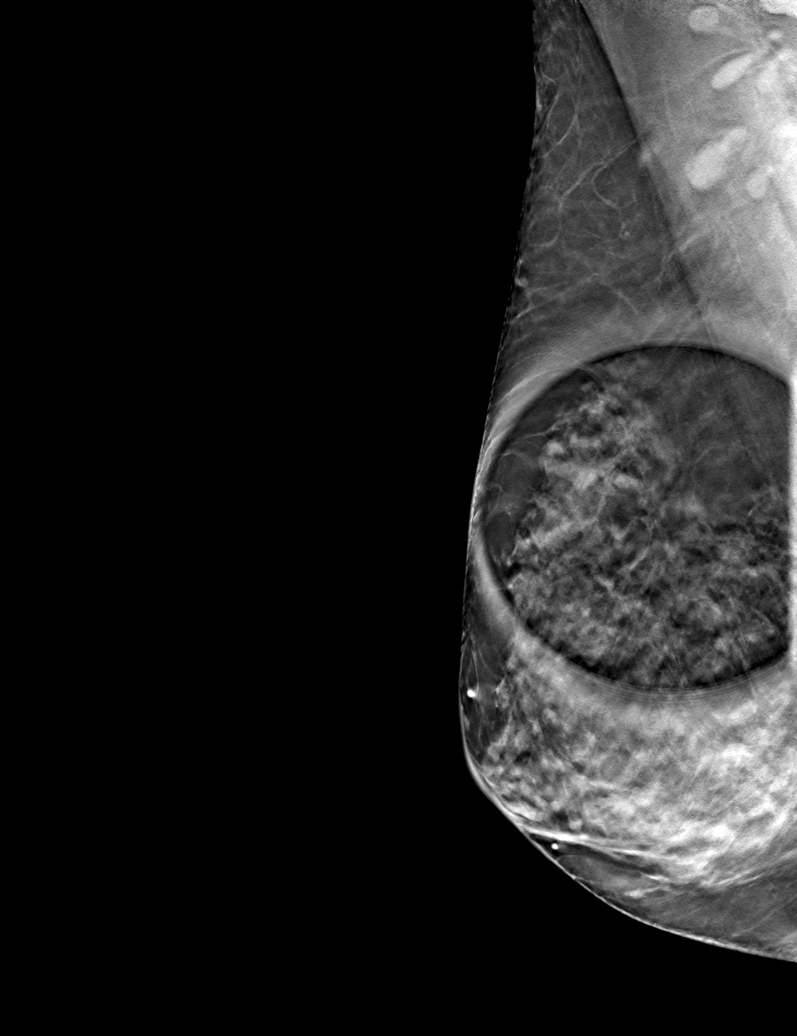

[6 of 13 positions shown; findings below may reference images not displayed]

ACR Breast Density Category c: The breast tissue is heterogeneously
dense, which may obscure small masses.
FINDINGS: In the lateral aspect of the right breast, there is a 4 mm obscured
oval mass with partially circumscribed margins.

Mammographic images were processed with CAD.

Physical exam of the lateral aspect of the right breast demonstrates
no discrete palpable masses.

Ultrasound targeted to the lateral aspect of the right breast
demonstrates a circumscribed anechoic mass with increased posterior
acoustic enhancement at 9 o'clock, 5 cm from the nipple. This
measures 5 x 4 x 3 mm, compatible with the mass identified
mammographically.
IMPRESSION: The mass identified on the mammogram corresponds with a benign cyst
in the right breast. No mammographic or targeted sonographic
evidence of right breast malignancy.

RECOMMENDATION:
Screening mammogram in one year.(Code:HV-R-OUT)

I have discussed the findings and recommendations with the patient.
Results were also provided in writing at the conclusion of the
visit. If applicable, a reminder letter will be sent to the patient
regarding the next appointment.

BI-RADS CATEGORY  2: Benign.

## 2017-03-07 ENCOUNTER — Other Ambulatory Visit: Payer: Self-pay | Admitting: Gynecology

## 2017-03-07 DIAGNOSIS — E2839 Other primary ovarian failure: Secondary | ICD-10-CM

## 2017-03-19 ENCOUNTER — Ambulatory Visit
Admission: RE | Admit: 2017-03-19 | Discharge: 2017-03-19 | Disposition: A | Discharge status: Home / Self Care | Payer: Medicare Other | Source: Ambulatory Visit | Attending: Gynecology | Admitting: Gynecology

## 2017-03-19 DIAGNOSIS — E2839 Other primary ovarian failure: Secondary | ICD-10-CM

## 2017-07-15 ENCOUNTER — Ambulatory Visit
Admission: RE | Admit: 2017-07-15 | Discharge: 2017-07-15 | Disposition: A | Discharge status: Home / Self Care | Payer: Medicare Other | Source: Ambulatory Visit | Attending: Gastroenterology | Admitting: Gastroenterology

## 2017-07-15 ENCOUNTER — Encounter
Admission: RE | Disposition: A | Discharge status: Home / Self Care | Payer: Self-pay | Source: Ambulatory Visit | Attending: Gastroenterology

## 2017-07-15 ENCOUNTER — Encounter: Payer: Self-pay | Admitting: *Deleted

## 2017-07-15 ENCOUNTER — Ambulatory Visit: Payer: Medicare Other | Admitting: Anesthesiology

## 2017-07-15 DIAGNOSIS — Z09 Encounter for follow-up examination after completed treatment for conditions other than malignant neoplasm: Secondary | ICD-10-CM | POA: Diagnosis not present

## 2017-07-15 DIAGNOSIS — Z8719 Personal history of other diseases of the digestive system: Secondary | ICD-10-CM | POA: Insufficient documentation

## 2017-07-15 DIAGNOSIS — Z7982 Long term (current) use of aspirin: Secondary | ICD-10-CM | POA: Insufficient documentation

## 2017-07-15 DIAGNOSIS — Q399 Congenital malformation of esophagus, unspecified: Secondary | ICD-10-CM | POA: Diagnosis not present

## 2017-07-15 DIAGNOSIS — Q438 Other specified congenital malformations of intestine: Secondary | ICD-10-CM | POA: Diagnosis not present

## 2017-07-15 DIAGNOSIS — K2950 Unspecified chronic gastritis without bleeding: Secondary | ICD-10-CM | POA: Insufficient documentation

## 2017-07-15 DIAGNOSIS — Z888 Allergy status to other drugs, medicaments and biological substances status: Secondary | ICD-10-CM | POA: Insufficient documentation

## 2017-07-15 DIAGNOSIS — Z79899 Other long term (current) drug therapy: Secondary | ICD-10-CM | POA: Insufficient documentation

## 2017-07-15 DIAGNOSIS — K219 Gastro-esophageal reflux disease without esophagitis: Secondary | ICD-10-CM | POA: Diagnosis not present

## 2017-07-15 HISTORY — PX: COLONOSCOPY WITH PROPOFOL: SHX5780

## 2017-07-15 HISTORY — PX: ESOPHAGOGASTRODUODENOSCOPY (EGD) WITH PROPOFOL: SHX5813

## 2017-07-15 SURGERY — ESOPHAGOGASTRODUODENOSCOPY (EGD) WITH PROPOFOL
Anesthesia: General

## 2017-07-15 SURGERY — COLONOSCOPY WITH PROPOFOL
Anesthesia: General

## 2017-07-15 MED ORDER — LIDOCAINE 2% (20 MG/ML) 5 ML SYRINGE
INTRAMUSCULAR | Status: DC | PRN
  Administered 2017-07-15: 25 mg via INTRAVENOUS

## 2017-07-15 MED ORDER — FENTANYL CITRATE (PF) 100 MCG/2ML IJ SOLN
INTRAMUSCULAR | Status: AC
  Filled 2017-07-15: qty 2

## 2017-07-15 MED ORDER — SODIUM CHLORIDE 0.9 % IV SOLN
INTRAVENOUS | Status: DC
  Administered 2017-07-15: 14:00:00 via INTRAVENOUS

## 2017-07-15 MED ORDER — LIDOCAINE HCL (PF) 2 % IJ SOLN
INTRAMUSCULAR | Status: AC
  Filled 2017-07-15: qty 10

## 2017-07-15 MED ORDER — PROPOFOL 10 MG/ML IV BOLUS
INTRAVENOUS | Status: DC | PRN
  Administered 2017-07-15 (×2): 50 mg via INTRAVENOUS

## 2017-07-15 MED ORDER — GLYCOPYRROLATE 0.2 MG/ML IJ SOLN
INTRAMUSCULAR | Status: AC
  Filled 2017-07-15: qty 1

## 2017-07-15 MED ORDER — FENTANYL CITRATE (PF) 100 MCG/2ML IJ SOLN
INTRAMUSCULAR | Status: DC | PRN
  Administered 2017-07-15 (×2): 50 ug via INTRAVENOUS

## 2017-07-15 MED ORDER — MIDAZOLAM HCL 5 MG/5ML IJ SOLN
INTRAMUSCULAR | Status: DC | PRN
  Administered 2017-07-15: 2 mg via INTRAVENOUS

## 2017-07-15 MED ORDER — PROPOFOL 500 MG/50ML IV EMUL
INTRAVENOUS | Status: AC
  Filled 2017-07-15: qty 50

## 2017-07-15 MED ORDER — GLYCOPYRROLATE 0.2 MG/ML IJ SOLN
INTRAMUSCULAR | Status: DC | PRN
  Administered 2017-07-15: 0.2 mg via INTRAVENOUS

## 2017-07-15 MED ORDER — PROPOFOL 500 MG/50ML IV EMUL
INTRAVENOUS | Status: DC | PRN
  Administered 2017-07-15: 120 ug/kg/min via INTRAVENOUS

## 2017-07-15 MED ORDER — SODIUM CHLORIDE 0.9 % IV SOLN: INTRAVENOUS | Status: DC

## 2017-07-15 MED ORDER — MIDAZOLAM HCL 2 MG/2ML IJ SOLN
INTRAMUSCULAR | Status: AC
  Filled 2017-07-15: qty 2

## 2017-07-15 MED ORDER — EPHEDRINE SULFATE 50 MG/ML IJ SOLN
INTRAMUSCULAR | Status: DC | PRN
  Administered 2017-07-15: 10 mg via INTRAVENOUS

## 2017-07-15 NOTE — Op Note (Signed)
[facility] Gastroenterology Patient Name: Amy Barton Procedure Date: 07/15/2017 2:30 PM MRN: [id number] Account #: [id number] Date of Birth: 1951-06-10 Admit Type: Outpatient Age: 67 Room: [facility] ENDO ROOM 1 Gender: Female Note Status: Finalized Procedure:            Colonoscopy Indications:          Personal history of colonic polyps Providers:            Christena Deem, MD Referring MD:         Hassell Halim MD (Referring MD) Medicines:            Monitored Anesthesia Care Complications:        No immediate complications. Procedure:            Pre-Anesthesia Assessment:                       - ASA Grade Assessment: II - A patient with mild                        systemic disease.                       After obtaining informed consent, the colonoscope was                        passed under direct vision. Throughout the procedure,                        the patient's blood pressure, pulse, and oxygen                        saturations were monitored continuously. The Olympus                        PCF-H180AL colonoscope ( S#: [id number] ) was introduced                        through the anus with the intention of advancing to the                        cecum. The scope was advanced to the sigmoid colon                        before the procedure was aborted. Medications were                        given. The colonoscopy was extremely difficult due to                        restricted mobility of the colon and a tortuous colon.                        The patient tolerated the procedure well. Findings:      the scope was carefully advanced to about the mid to upper sigmoid       region through a marked tortuous pathway. Multiple small diverticuli       were noted. Once in the mid sigmoid region, I was unable to move further       due to sharp angulations and tortuous path.  The retroflexed view of the distal rectum and anal verge was normal and    showed no anal or rectal abnormalities.      The digital rectal exam was normal, note multiple esternal skin tags. Impression:           - The distal rectum and anal verge are normal on                        retroflexion view.                       - No specimens collected. Recommendation:       - Discharge patient to home. Procedure Code(s):    --- Professional ---                       [id number], 53, Colonoscopy, flexible; diagnostic, including                        collection of specimen(s) by brushing or washing, when                        performed (separate procedure) Diagnosis Code(s):    --- Professional ---                       Z86.010, Personal history of colonic polyps CPT copyright 2016 American Medical Association. All rights reserved. The codes documented in this report are preliminary and upon coder review may  be revised to meet current compliance requirements. Christena DeemMartin U Kavan Devan, MD 07/15/2017 3:28:09 PM This report has been signed electronically. Number of Addenda: 0 Note Initiated On: 07/15/2017 2:30 PM Total Procedure Duration: 0 hours 25 minutes 30 seconds       [facility]

## 2017-07-15 NOTE — H&P (Signed)
Outpatient short stay form Pre-procedure 07/15/2017 2:29 PM Amy Barton  Primary Physician: Dr Kandyce RudMarcus Babaoff  Reason for visit:  EGD and colonoscopy  History of present illness:  Patient is a 67 year old female presenting today as above. She has a personal history of GERD with reflux esophagitis. She has been on Celebrex. She does take DEXilant for her reflux. She also has a personal history of adenomatous colon polyps. Her last colonoscopy was 02/22/2012 with no polyps noted at that time. She tolerated her prep well. She does take an 81 mg aspirin but has not for at least a week. She states takes no other aspirin products or blood thinning agents.    Current Facility-Administered Medications:  .  0.9 %  sodium chloride infusion, , Intravenous, Continuous, Amy DeemSkulskie, Martin U, Barton, Last Rate: 20 mL/hr at 07/15/17 1353 .  0.9 %  sodium chloride infusion, , Intravenous, Continuous, Amy DeemSkulskie, Martin U, Barton  Medications Prior to Admission  Medication Sig Dispense Refill Last Dose  . albuterol (VENTOLIN HFA) 108 (90 Base) MCG/ACT inhaler Inhale 1-2 puffs into the lungs every 6 (six) hours as needed for wheezing or shortness of breath.   Past Month at Unknown time  . aspirin EC 81 MG tablet Take 81 mg by mouth daily.   Past Week at Unknown time  . celecoxib (CELEBREX) 200 MG capsule Take 200 mg by mouth every evening.   Past Week at Unknown time  . Cholecalciferol (VITAMIN D3) 2000 UNITS TABS Take 1 capsule by mouth daily.   Past Week at Unknown time  . citalopram (CELEXA) 20 MG tablet Take 20 mg by mouth every evening.   07/14/2017 at Unknown time  . diphenhydrAMINE (BENADRYL) 50 MG capsule Take 50 mg by mouth every evening. Reported on 07/07/2015   Past Week at Unknown time  . estradiol (VIVELLE-DOT) 0.05 MG/24HR patch Place 1 patch onto the skin 2 (two) times a week.   07/14/2017 at Unknown time  . fesoterodine (TOVIAZ) 4 MG TB24 tablet Take 4 mg by mouth every evening.   07/14/2017 at Unknown  time  . fluticasone (FLONASE) 50 MCG/ACT nasal spray Place 1 spray into both nostrils 2 (two) times daily. 16 g 5 07/14/2017 at Unknown time  . montelukast (SINGULAIR) 10 MG tablet Take 10 mg by mouth at bedtime.   Past Week at Unknown time  . pantoprazole (PROTONIX) 40 MG tablet Take 40 mg by mouth every morning. Reported on 07/07/2015   Past Week at Unknown time  . pentosan polysulfate (ELMIRON) 100 MG capsule Take 100 mg by mouth 3 (three) times daily.   07/14/2017 at Unknown time  . solifenacin (VESICARE) 10 MG tablet Take 5 mg by mouth daily.   07/14/2017 at Unknown time  . SUMAtriptan (IMITREX) 20 MG/ACT nasal spray One spray as directed.  May take a second dose after 2 hours if needed.   Past Month at Unknown time  . Turmeric 500 MG CAPS Take 1 capsule by mouth daily.   Past Week at Unknown time  . ACYCLOVIR PO Take 1 tablet by mouth as needed (TAKE UP TO FIVE DAYS WITH BREAKOUT OF FEVER BLISTERS).   Completed Course at Unknown time  . dexlansoprazole (DEXILANT) 60 MG capsule Take by mouth.     . Meth-Hyo-M Bl-Na Phos-Ph Sal (URIBEL) 118 MG CAPS Take 1 capsule by mouth 4 (four) times daily as needed.   Not Taking at Unknown time  . simvastatin (ZOCOR) 10 MG tablet Take by mouth.     .Marland Kitchen  valACYclovir (VALTREX) 1000 MG tablet Take by mouth.   Completed Course at Unknown time     Allergies  Allergen Reactions  . Allegra-D  [Fexofenadine-Pseudoephed Er] Nausea And Vomiting  . Claritin-D 12 Hour [Loratadine-Pseudoephedrine Er] Other (See Comments)    "DIZZINESS"  . Chlorhexidine Gluconate Rash    Rash after using wipes pre operatively      Past Medical History:  Diagnosis Date  . Allergic rhinitis   . DDD (degenerative disc disease), lumbar    L3 - L4  (epidurial injection)  intermittant leg numbness  . Environmental and seasonal allergies   . GERD (gastroesophageal reflux disease)   . Pelvic pain in female   . SUI (stress urinary incontinence, female)   . Urethral stenosis      Review of systems:      Physical Exam    Heart and lungs: Regular rate and rhythm without rub or gallop, lungs are bilaterally clear.    HEENT: Normocephalic atraumatic eyes are anicteric    Other:     Pertinant exam for procedure: Soft nontender nondistended bowel sounds positive normoactive.    Planned proceedures: EGD, colonoscopy and indicated procedures. I have discussed the risks benefits and complications of procedures to include not limited to bleeding, infection, perforation and the risk of sedation and the patient wishes to proceed.    Amy Deem, [facility] 07/15/2017  2:29 PM

## 2017-07-15 NOTE — Anesthesia Post-op Follow-up Note (Signed)
Anesthesia QCDR form completed.        

## 2017-07-15 NOTE — Op Note (Signed)
[facility] Gastroenterology Patient Name: Amy Barton Procedure Date: 07/15/2017 2:30 PM MRN: [id number] Account #: [id number] Date of Birth: [birth year] Admit Type: Outpatient Age: [age] Room: [facility] ENDO ROOM 1 Gender: Female Note Status: Finalized Procedure:            Upper GI endoscopy Providers:            Christena DeemMartin U. Pistol Kessenich, MD Referring MD:         Hassell HalimMarcus E. Babaoff MD (Referring MD) Medicines:            Monitored Anesthesia Care Complications:        No immediate complications. Procedure:            Pre-Anesthesia Assessment:                       - ASA Grade Assessment: II - A patient with mild                        systemic disease.                       After obtaining informed consent, the endoscope was                        passed under direct vision. Throughout the procedure,                        the patient's blood pressure, pulse, and oxygen                        saturations were monitored continuously. The Endoscope                        was introduced through the mouth, and advanced to the                        third part of duodenum. The upper GI endoscopy was                        accomplished without difficulty. The patient tolerated                        the procedure well. Findings:      The Z-line was irregular. Biopsies were taken with a cold forceps for       histology.      The lower third of the esophagus was moderately tortuous.      The exam of the esophagus was otherwise normal.      Patchy mild inflammation characterized by erosions and erythema was       found in the gastric antrum. Biopsies were taken with a cold forceps for       histology. Biopsies were taken with a cold forceps for Helicobacter       pylori testing.      The cardia and gastric fundus were normal on retroflexion.      The examined duodenum was normal. Impression:           - Z-line irregular. Biopsied.                       - Tortuous esophagus.                      -  Gastritis. Biopsied.                       - Normal examined duodenum. Recommendation:       - Continue present medications.                       - Await pathology results.                       - Telephone GI clinic for pathology results in 1 week. Procedure Code(s):    --- Professional ---                       [phone], Esophagogastroduodenoscopy, flexible, transoral;                        with biopsy, single or multiple Diagnosis Code(s):    --- Professional ---                       K22.8, Other specified diseases of esophagus                       Q39.9, Congenital malformation of esophagus, unspecified                       K29.70, Gastritis, unspecified, without bleeding CPT copyright 2016 American Medical Association. All rights reserved. The codes documented in this report are preliminary and upon coder review may  be revised to meet current compliance requirements. Christena Deem, MD 07/15/2017 2:56:05 PM This report has been signed electronically. Number of Addenda: 0 Note Initiated On: 07/15/2017 2:30 PM      [facility]

## 2017-07-15 NOTE — Anesthesia Preprocedure Evaluation (Signed)
Anesthesia Evaluation  Patient identified by MRN, date of birth, ID band Patient awake    Reviewed: Allergy & Precautions, H&P , NPO status , Patient's Chart, lab work & pertinent test results, reviewed documented beta blocker date and time   History of Anesthesia Complications Negative for: history of anesthetic complications  Airway Mallampati: I  TM Distance: >3 FB Neck ROM: full    Dental  (+) Missing, Dental Advidsory Given, Teeth Intact   Pulmonary neg shortness of breath, asthma , neg sleep apnea, neg COPD, neg recent URI,           Cardiovascular Exercise Tolerance: Good negative cardio ROS       Neuro/Psych negative neurological ROS  negative psych ROS   GI/Hepatic Neg liver ROS, GERD  ,  Endo/Other  negative endocrine ROS  Renal/GU negative Renal ROS  negative genitourinary   Musculoskeletal   Abdominal   Peds  Hematology negative hematology ROS (+)   Anesthesia Other Findings Past Medical History: No date: Allergic rhinitis No date: DDD (degenerative disc disease), lumbar     Comment:  L3 - L4  (epidurial injection)  intermittant leg               numbness No date: Environmental and seasonal allergies No date: GERD (gastroesophageal reflux disease) No date: Pelvic pain in female No date: SUI (stress urinary incontinence, female) No date: Urethral stenosis   Reproductive/Obstetrics negative OB ROS                             Anesthesia Physical Anesthesia Plan  ASA: II  Anesthesia Plan: General   Post-op Pain Management:    Induction: Intravenous  PONV Risk Score and Plan: 3 and Propofol infusion  Airway Management Planned: Nasal Cannula  Additional Equipment:   Intra-op Plan:   Post-operative Plan:   Informed Consent: I have reviewed the patients History and Physical, chart, labs and discussed the procedure including the risks, benefits and alternatives  for the proposed anesthesia with the patient or authorized representative who has indicated his/her understanding and acceptance.   Dental Advisory Given  Plan Discussed with: Anesthesiologist, CRNA and Surgeon  Anesthesia Plan Comments:         Anesthesia Quick Evaluation

## 2017-07-15 NOTE — Transfer of Care (Signed)
Immediate Anesthesia Transfer of Care Note  Patient: Amy Barton  Procedure(s) Performed: COLONOSCOPY WITH PROPOFOL (N/A ) ESOPHAGOGASTRODUODENOSCOPY (EGD) WITH PROPOFOL (N/A )  Patient Location: Endoscopy Unit  Anesthesia Type:General  Level of Consciousness: awake and alert   Airway & Oxygen Therapy: Patient Spontanous Breathing and Patient connected to nasal cannula oxygen  Post-op Assessment: Report given to RN and Post -op Vital signs reviewed and stable  Post vital signs: Reviewed  Last Vitals:  Vitals:   07/15/17 1528 07/15/17 1529  BP: 116/77 116/77  Pulse: (!) 108 (!) 109  Resp: 17 16  Temp: (!) 36.4 C 36.7 C  SpO2: 99% 99%    Last Pain:  Vitals:   07/15/17 1341  TempSrc: Tympanic         Complications: No apparent anesthesia complications

## 2017-07-15 NOTE — Anesthesia Postprocedure Evaluation (Signed)
Anesthesia Post Note  Patient: [name]  Procedure(s) Performed: COLONOSCOPY WITH PROPOFOL (N/A ) ESOPHAGOGASTRODUODENOSCOPY (EGD) WITH PROPOFOL (N/A )  Patient location during evaluation: Endoscopy Anesthesia Type: General Level of consciousness: awake and alert Pain management: pain level controlled Vital Signs Assessment: post-procedure vital signs reviewed and stable Respiratory status: spontaneous breathing, nonlabored ventilation, respiratory function stable and patient connected to nasal cannula oxygen Cardiovascular status: blood pressure returned to baseline and stable Postop Assessment: no apparent nausea or vomiting Anesthetic complications: no     Last Vitals:  Vitals:   07/15/17 1540 07/15/17 1550  BP: 134/82 136/89  Pulse: (!) 101 95  Resp: 18 13  Temp:    SpO2: 100% 99%    Last Pain:  Vitals:   07/15/17 1550  TempSrc:   PainSc: 0-No pain                 [name]

## 2017-07-16 ENCOUNTER — Encounter: Payer: Self-pay | Admitting: Gastroenterology

## 2017-07-18 LAB — SURGICAL PATHOLOGY

## 2017-07-26 ENCOUNTER — Other Ambulatory Visit: Payer: Self-pay | Admitting: Gastroenterology

## 2017-07-26 DIAGNOSIS — Q438 Other specified congenital malformations of intestine: Secondary | ICD-10-CM

## 2017-09-05 ENCOUNTER — Ambulatory Visit
Admission: RE | Admit: 2017-09-05 | Discharge: 2017-09-05 | Disposition: A | Discharge status: Home / Self Care | Payer: Medicare Other | Source: Ambulatory Visit | Attending: Gastroenterology | Admitting: Gastroenterology

## 2017-09-05 DIAGNOSIS — Q438 Other specified congenital malformations of intestine: Secondary | ICD-10-CM

## 2017-11-18 ENCOUNTER — Encounter: Payer: Self-pay | Admitting: Pediatrics

## 2017-11-18 ENCOUNTER — Ambulatory Visit: Payer: Medicare Other | Admitting: Pediatrics

## 2017-11-18 VITALS — BP 120/74 | HR 82 | Temp 98.0°F | Resp 16 | Ht 60.0 in | Wt 118.4 lb

## 2017-11-18 DIAGNOSIS — J3089 Other allergic rhinitis: Secondary | ICD-10-CM | POA: Diagnosis not present

## 2017-11-18 DIAGNOSIS — J4530 Mild persistent asthma, uncomplicated: Secondary | ICD-10-CM | POA: Diagnosis not present

## 2017-11-18 DIAGNOSIS — K219 Gastro-esophageal reflux disease without esophagitis: Secondary | ICD-10-CM | POA: Diagnosis not present

## 2017-11-18 MED ORDER — ALBUTEROL SULFATE HFA 108 (90 BASE) MCG/ACT IN AERS: 1.0000 | INHALATION_SPRAY | Freq: Four times a day (QID) | RESPIRATORY_TRACT | 1 refills | Status: AC | PRN

## 2017-11-18 NOTE — Patient Instructions (Addendum)
Begin cetirizine 10 mg once a day as needed for a runny nose Continue Flonase nasal spray 2 sprays once a day as needed for a runny nose Continue montelukast 10 mg once a day to prevent cough or wheeze Continue Ventolin inhaler 2 puffs every 4 hours as needed for cough or wheeze Continue Dexilant 60 mg once a day to control reflux Prednisone 10 mg tablets. Take 2 tablets once a day for 4 days, then take 1 tablet on the 5th day, then stop  Continue the other medications as listed in the chart  Call me if this plan is not working well for you  Follow up in  6 months or sooner if needed

## 2017-11-18 NOTE — Progress Notes (Addendum)
[address] [place] [address] Dept: [phone]  FOLLOW UP NOTE  Patient ID: Amy Barton, female    DOB: 12-14-1950  Age: 67 y.o. MRN: [id number] Date of Office Visit: 11/18/2017  Assessment  Chief Complaint: Cough  HPI [name] is a 67 year old female who presents to the clinic for a follow up visit. She was last seen in the clinic on 07/07/2015 by Dr. Beaulah Dinning for evaluation of asthma, allergic rhinitis, and reflux. At that time, she was started on cetirizine, Flonase, and albuterol inhaler as needed.   At today's visit, she reports that she began to experience an increased cough about 3-4 weeks ago that is sometimes productive with clear to white phlegm. She reports this cough occurs at this time of the year every year. She is taking Benadryl once a day and Flonase nasal spray once a day.   Asthma is reported as well controlled. She does report intermittent shortness of breath and wheeze which is worse with activity. She does not use an asthma controller inhaler and uses her albuterol about 1 time every 2 months with relief.   Reflux is reported as well controlled with Dexilant 60 mg once a day. She reports that she was experiencing a feeling of pressure in her chest that was continuous and lasted about 4 weeks. She indicates her epigastric region. She reports the pressure has completely resolved 1 week ago. She is followed by GI specialist, Dr. Marva Panda at [facility].   Her current medications are listed in the chart.    Drug Allergies:  Allergies  Allergen Reactions  . Allegra-D  [Fexofenadine-Pseudoephed Er] Nausea And Vomiting  . Antihistamines, Chlorpheniramine-Type Other (See Comments)  . Claritin-D 12 Hour [Loratadine-Pseudoephedrine Er] Other (See Comments)    "DIZZINESS"  . Chlorhexidine Gluconate Rash    Rash after using wipes pre operatively     Physical Exam: BP 120/74   Pulse 82   Temp 98 F (36.7 C) (Oral)   Resp 16    Ht 5' (1.524 m)   Wt 118 lb 6.4 oz (53.7 kg)   SpO2 95%   BMI 23.12 kg/m    Physical Exam  Constitutional: She is oriented to person, place, and time. She appears well-developed and well-nourished.  HENT:  Head: Normocephalic and atraumatic.  Right Ear: External ear normal.  Left Ear: External ear normal.  Bilateral nares slightly erythematous and edematous with clear nasal drainage noted. Pharynx erythematous with a cobblestone appearance. No exudate noted. Ears normal. Eyes normal.  Eyes: Conjunctivae are normal.  Neck: Normal range of motion. Neck supple.  Cardiovascular: Normal rate, regular rhythm and normal heart sounds.  No murmur noted  Pulmonary/Chest: Effort normal and breath sounds normal.  Lungs clear to auscultation  Abdominal: Soft. Bowel sounds are normal.  No tenderness or guarding.   Musculoskeletal: Normal range of motion.  Neurological: She is alert and oriented to person, place, and time.  Skin: Skin is warm and dry.  Psychiatric: She has a normal mood and affect. Her behavior is normal. Judgment and thought content normal.    Diagnostics: FVC 2.87, FEV1 2.14. Predicted FVC 2.60, predicted FEV1 1.98. Spirometry is within the normal range.    Assessment and Plan: 1. Mild persistent asthma without complication   2. Other allergic rhinitis   3. Gastroesophageal reflux disease, esophagitis presence not specified     Patient Instructions  Begin cetirizine 10 mg once a day as needed for a runny nose Continue  Flonase nasal spray 2 sprays once a day as needed for a runny nose Continue montelukast 10 mg once a day to prevent cough or wheeze Continue Ventolin inhaler 2 puffs every 4 hours as needed for cough or wheeze Continue Dexilant 60 mg once a day to control reflux Prednisone 10 mg tablets. Take 2 tablets once a day for 4 days, then take 1 tablet on the 5th day, then stop  Continue the other medications as listed in the chart  Call me if this plan is not  working well for you  Follow up in  6 months or sooner if needed    Return in about 6 months (around 05/21/2018), or if symptoms worsen or fail to improve.   Thank you for the opportunity to care for this patient.  Please do not hesitate to contact me with questions.  Thermon Leyland, FNP [facility]  I have provided oversight concerning Thermon Leyland' evaluation and treatment of this patient's health issues addressed during today's encounter. I agree with the assessment and therapeutic plan as outlined in the note.   Thank you for the opportunity to care for this patient.  Please do not hesitate to contact me with questions.  Tonette Bihari, M.D.  [facility] [address] [place], [place] [address] [phone]

## 2018-08-23 IMAGING — US US ABDOMEN COMPLETE
1 series · 14 of 25 positions shown · non-contrast
Comparison: Abdominal ultrasound dated October 04, 2005.

CLINICAL DATA: Dyspepsia for the past 3 years

EXAM:
ABDOMEN ULTRASOUND COMPLETE

[Series 1: us abdomen complete · 0.15mm/px · 14 of 120 slices shown]
[im 1/120]
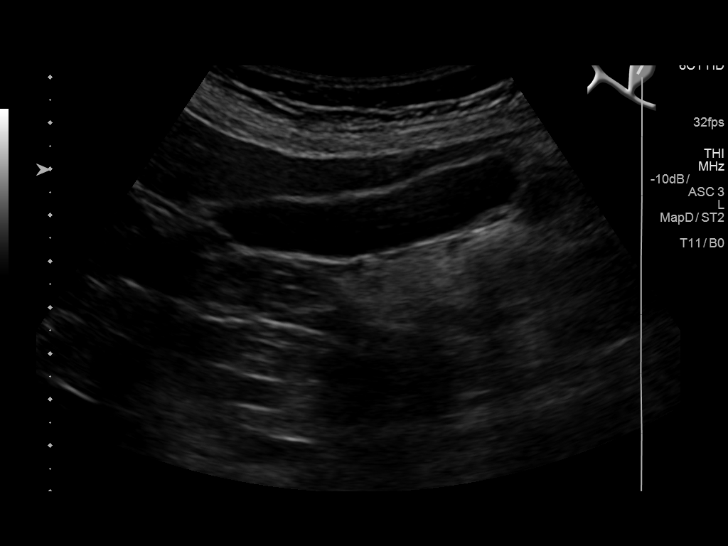
[im 10/120]
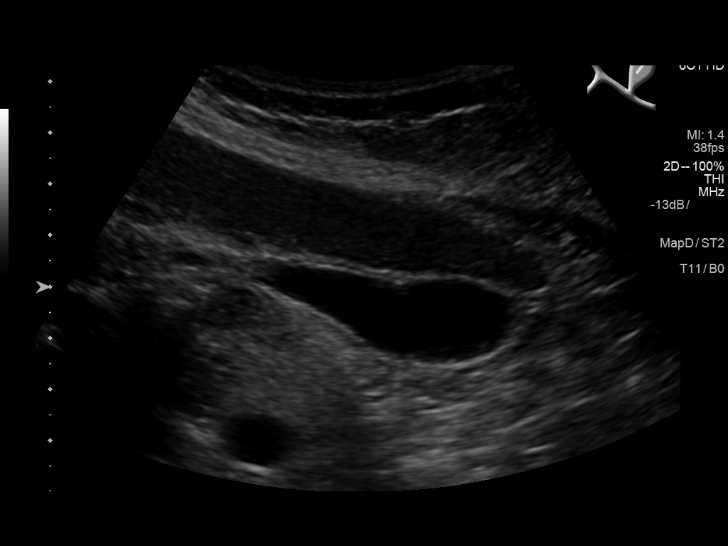
[im 20/120]
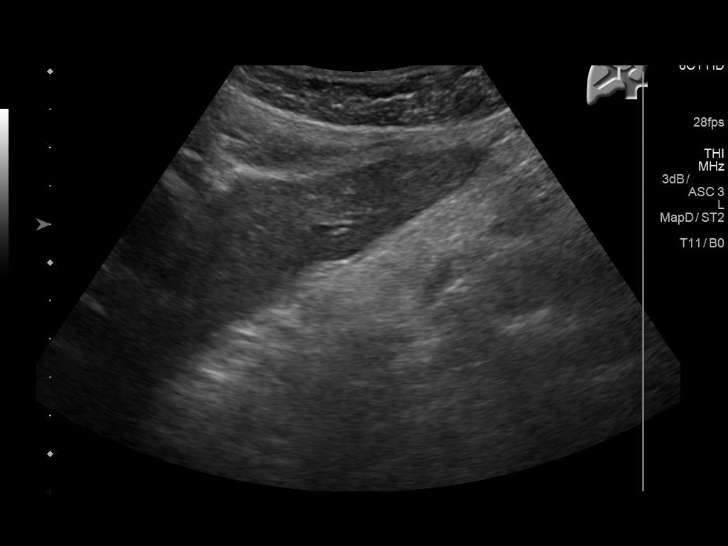
[im 30/120]
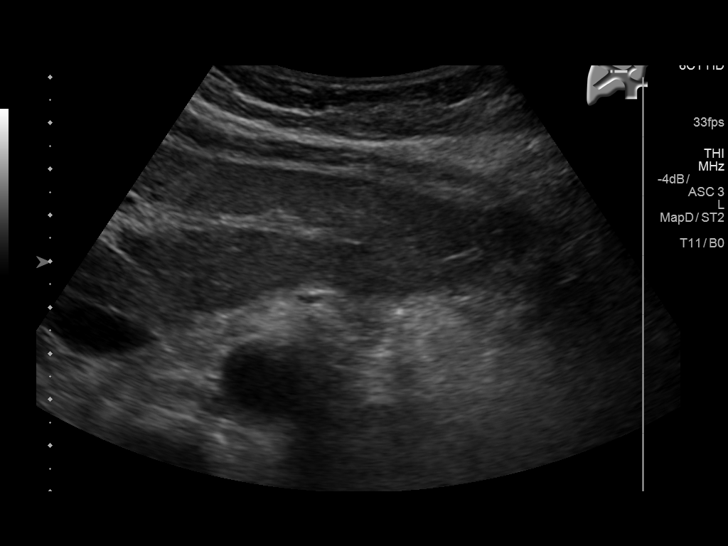
[im 40/120]
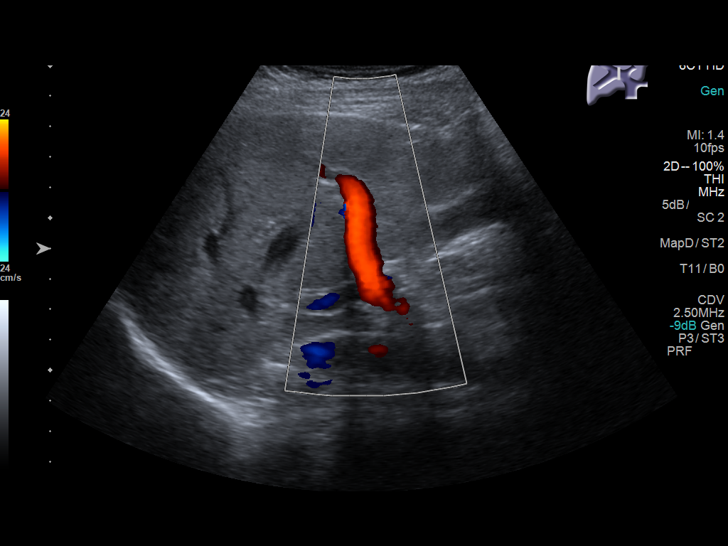
[im 45/120]
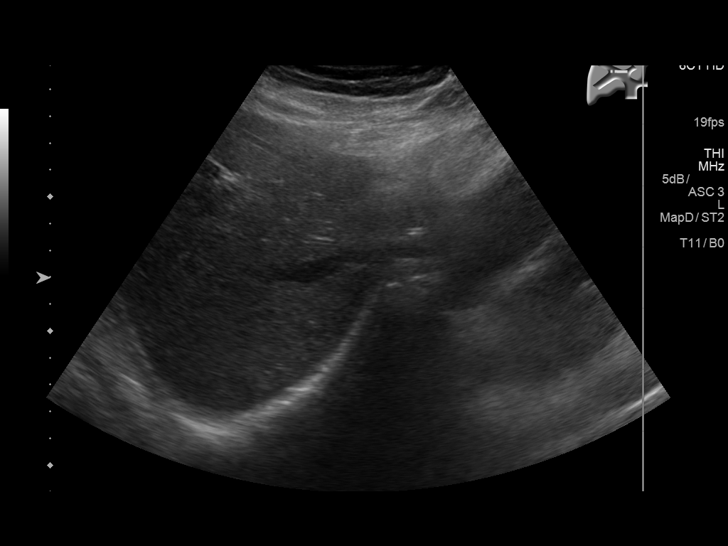
[im 55/120]
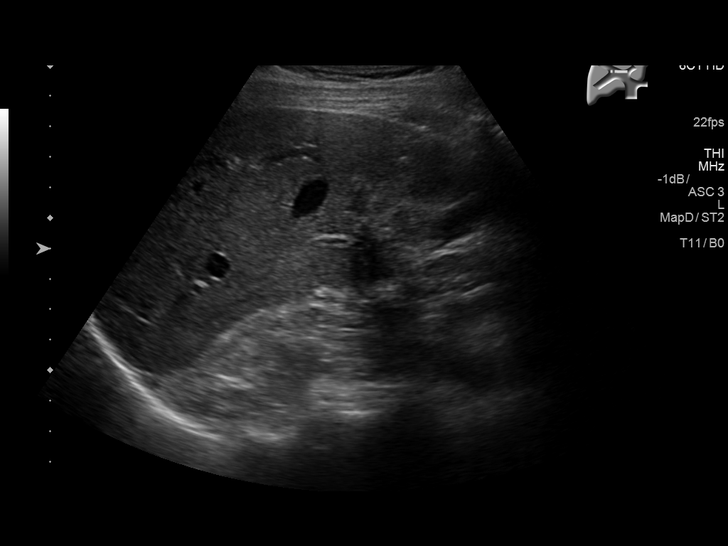
[im 65/120]
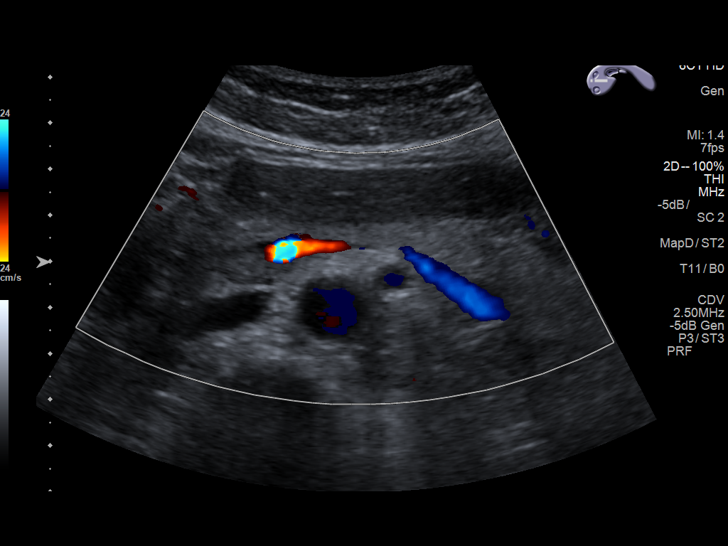
[im 75/120]
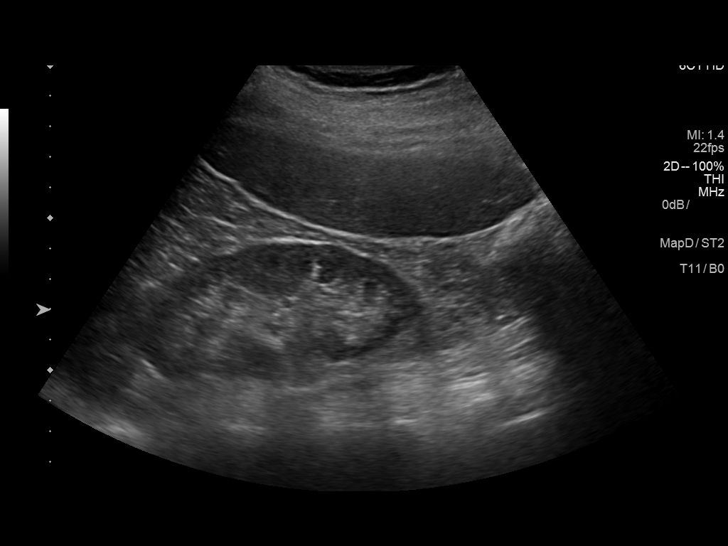
[im 80/120]
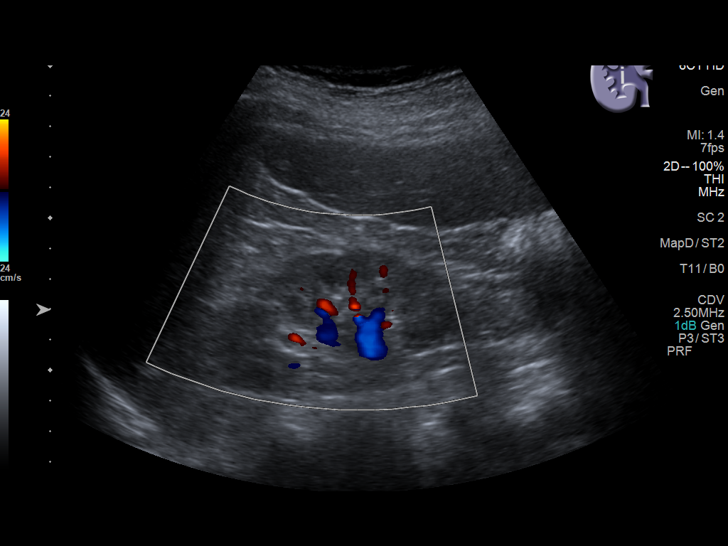
[im 90/120]
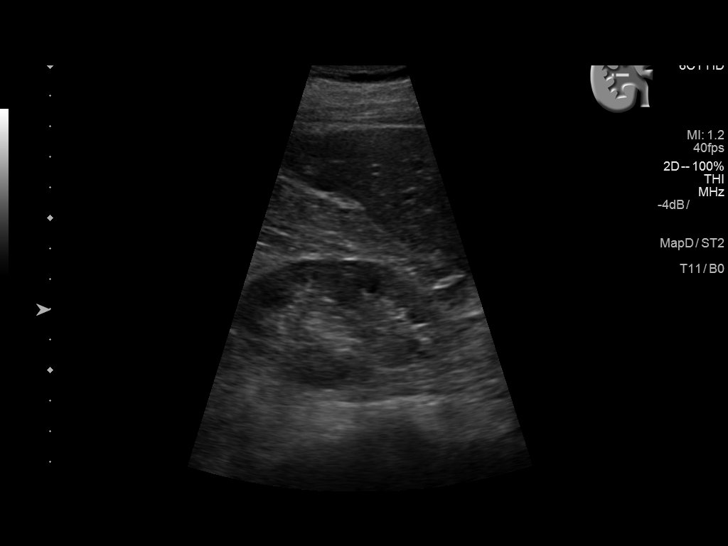
[im 100/120]
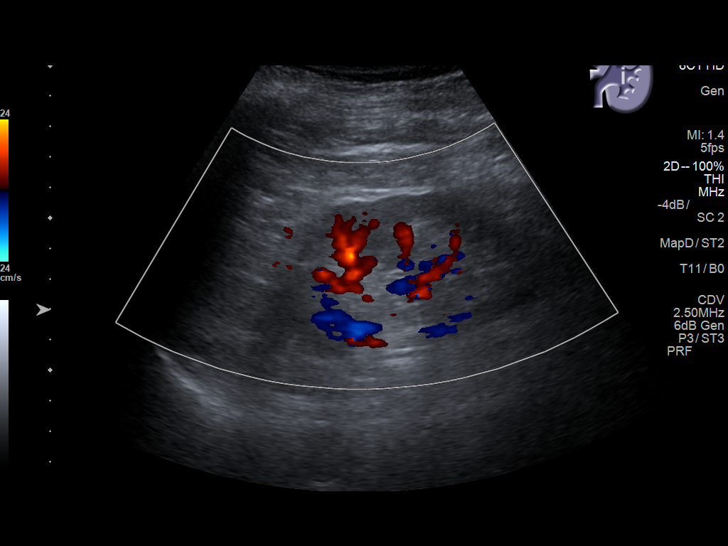
[im 110/120]
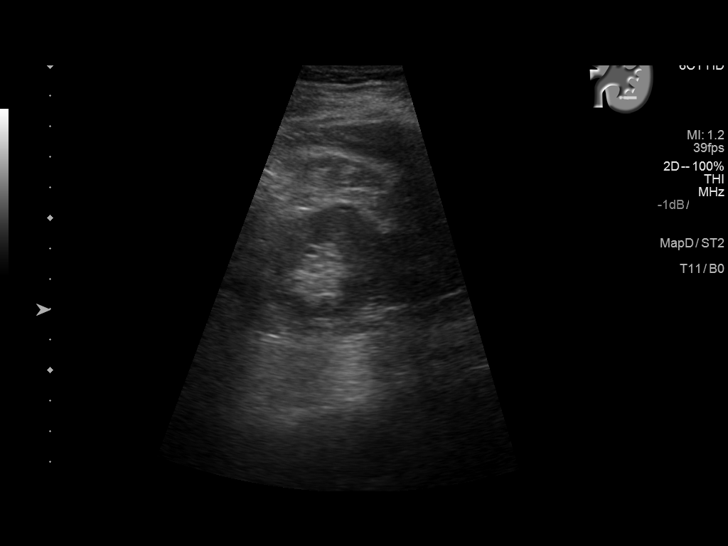
[im 120/120]
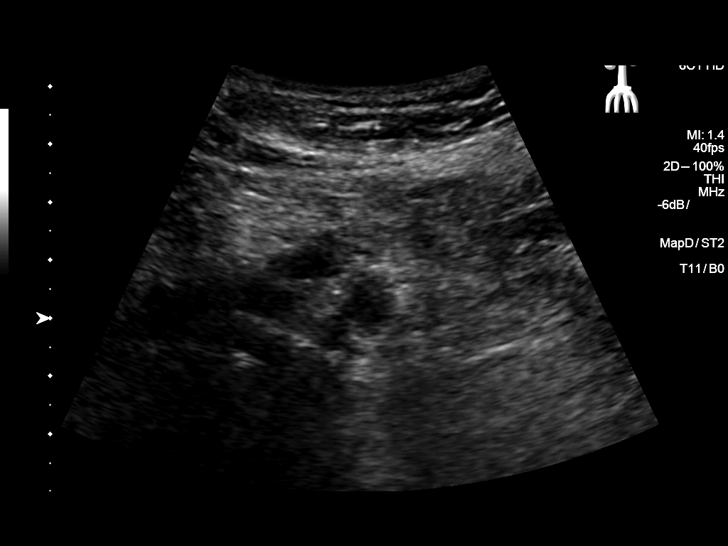

[14 of 25 positions shown; findings below may reference images not displayed]

FINDINGS: Gallbladder: No gallstones or wall thickening visualized. No
sonographic Murphy sign noted by sonographer.

Common bile duct: Diameter: 3 mm

Liver: No focal lesion identified. Within normal limits in
parenchymal echogenicity.

IVC: No abnormality visualized.

Pancreas: The pancreatic head and tail are largely obscured by bowel
gas. The pancreatic body is normal in appearance.

Spleen: Size and appearance within normal limits.

Right Kidney: Length: 9.2 cm. Echogenicity within normal limits. No
mass or hydronephrosis visualized.

Left Kidney: Length: 9.6 cm. Echogenicity within normal limits. No
mass or hydronephrosis visualized.

Abdominal aorta: No aneurysm visualized.

Other findings: There is no ascites.
IMPRESSION: 1. No gallstones or sonographic evidence of acute cholecystitis.
2. No acute intra-abdominal abnormality is observed. The pancreatic
head and tail are largely obscured by bowel gas.

## 2018-11-30 IMAGING — CT CT VIRTUAL COLONOSCOPY DIAGNOSTIC
2 of 10 series · 12 of 46 positions shown, 14 images · non-contrast
Comparison: none

CLINICAL DATA: Incomplete colonoscopy, tortuous colon

EXAM:
CT VIRTUAL COLONOSCOPY DIAGNOSTIC
TECHNIQUE: The patient was given a standard bowel preparation with Gastrografin
and barium for fluid and stool tagging respectively. The quality of
the bowel preparation is good. Automated CO2 insufflation of the
colon was performed prior to image acquisition and colonic
distention is good. Image post processing was used to generate a 3D
endoluminal fly-through projection of the colon and to
electronically subtract stool/fluid as appropriate.

[Series 7: supine colon 1.50 br40 s3 ax · axial · 0.49mm/px · z∈[+1419,+1761]mm · 9 of 429 slices shown, 11 images]
[im 43/429  soft-tissue]
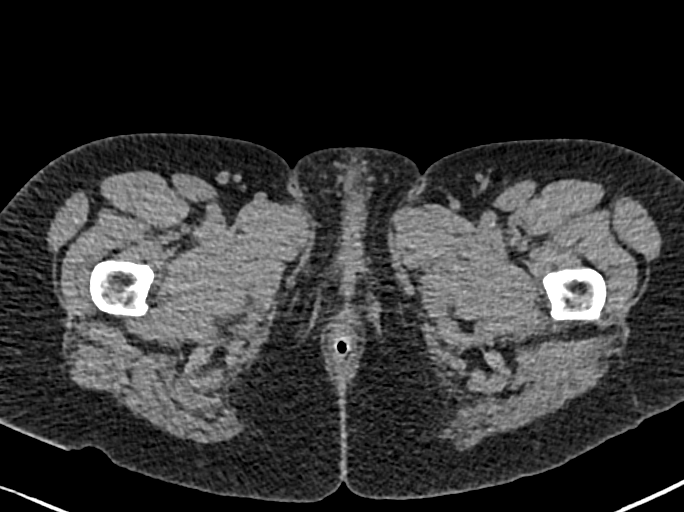
[im 43/429  bone]
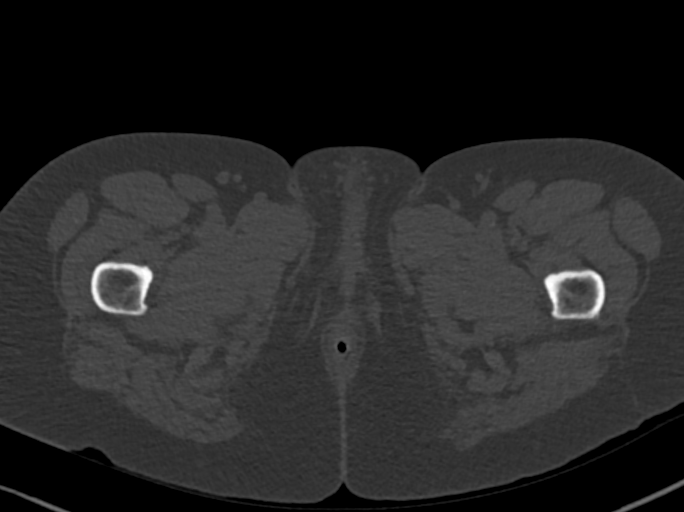
[im 86/429  soft-tissue]
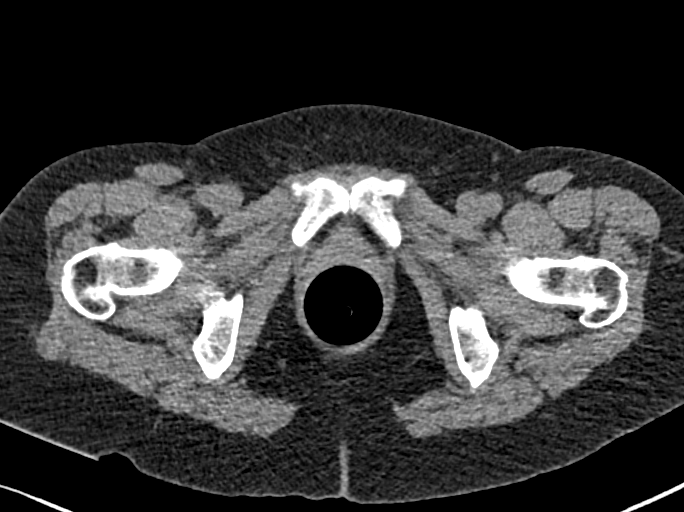
[im 129/429  soft-tissue]
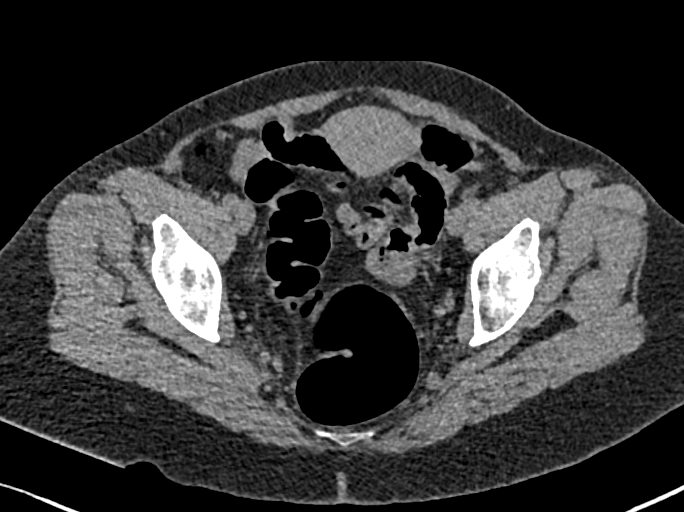
[im 172/429  soft-tissue]
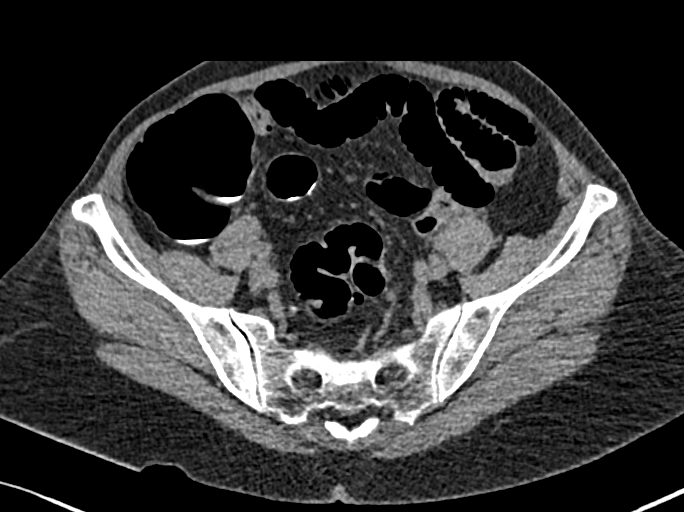
[im 215/429  soft-tissue]
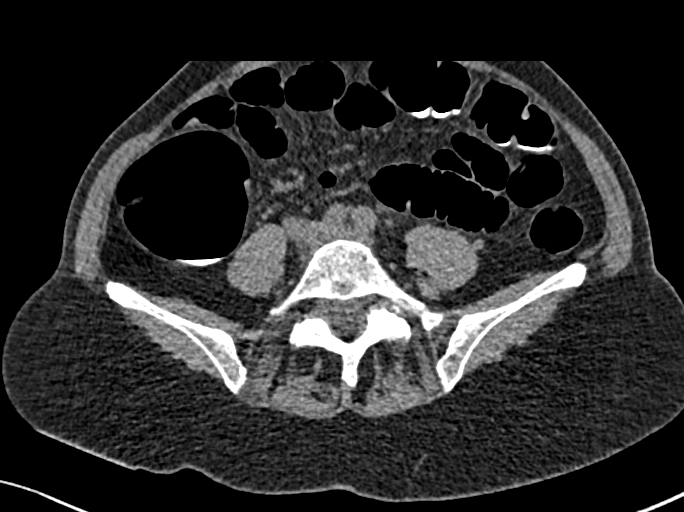
[im 257/429  soft-tissue]
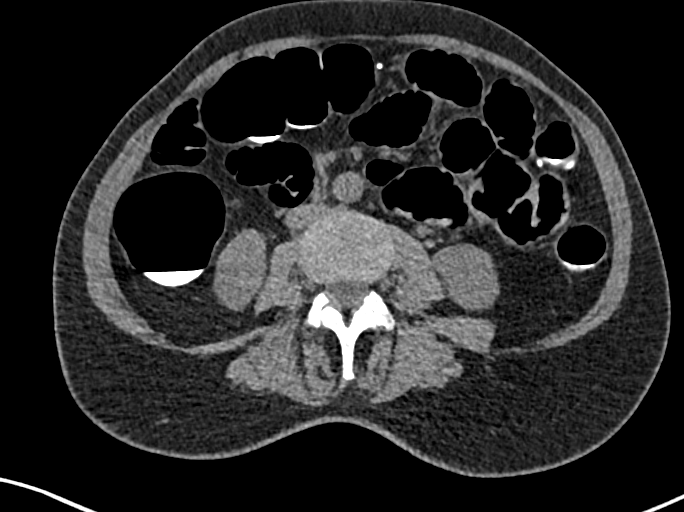
[im 300/429  soft-tissue]
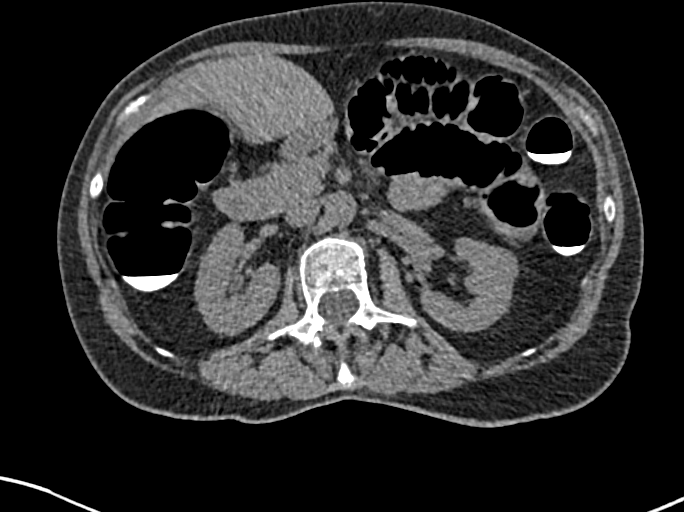
[im 343/429  soft-tissue]
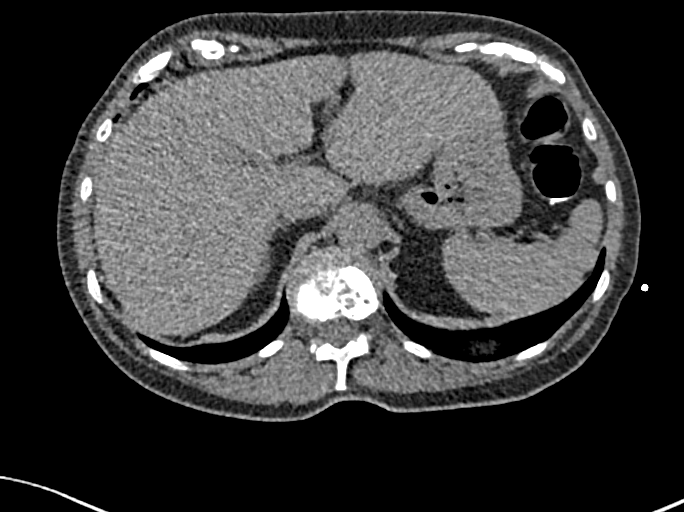
[im 386/429  soft-tissue]
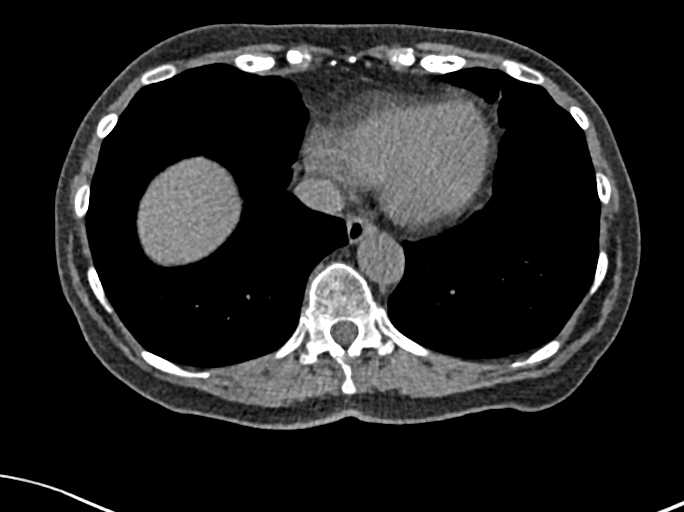
[im 386/429  bone]
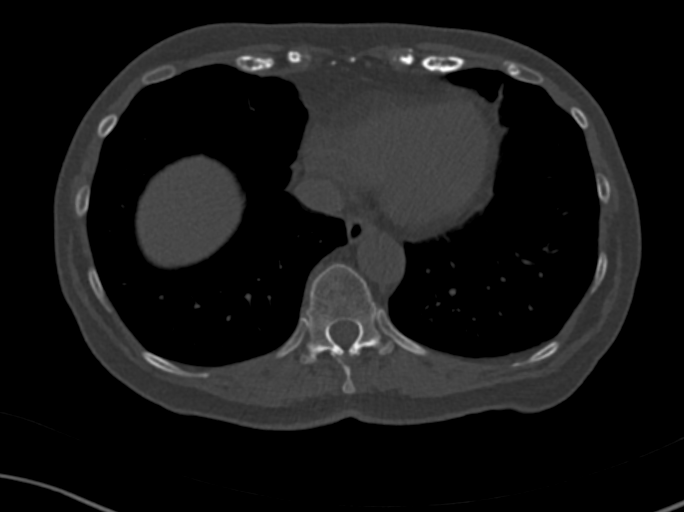

[Series 15: prone colon 1.50 br40 s3 cor · coronal · 0.71mm/px · 3 of 305 slices shown]
[im 61/305  soft-tissue]
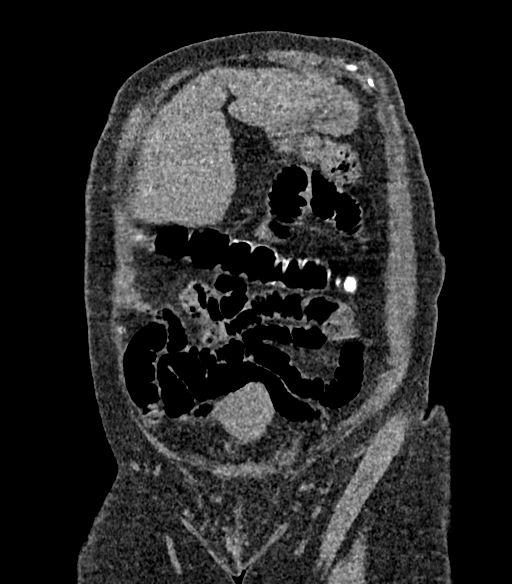
[im 122/305  soft-tissue]
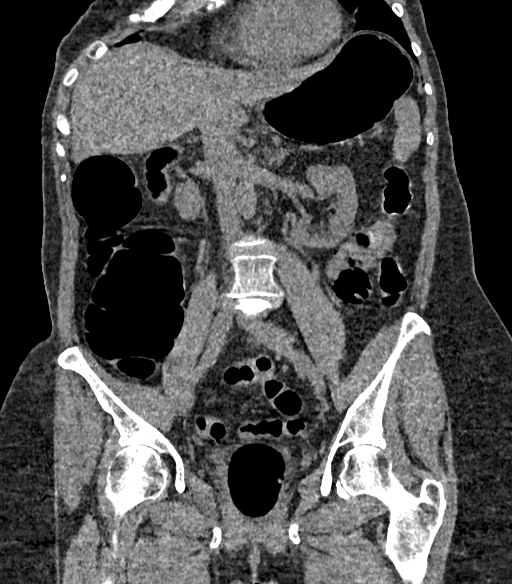
[im 183/305  soft-tissue]
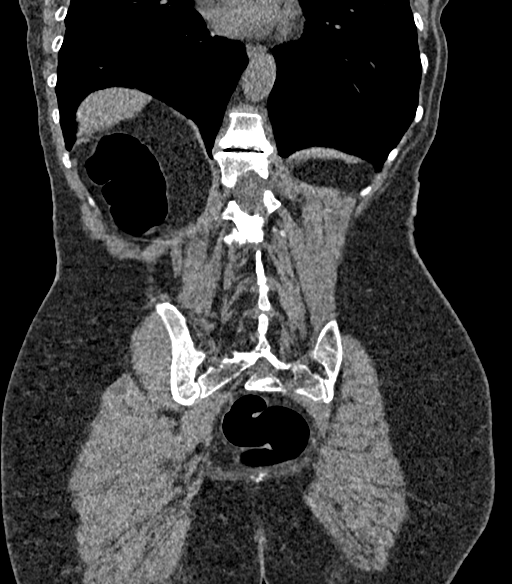

[12 of 46 positions shown; findings below may reference images not displayed]

FINDINGS: VIRTUAL COLONOSCOPY

Scattered sigmoid and descending colonic diverticulosis. Mildly
tortuous colon. No fixed polypoid filling defects or annular
constricting lesions.

Virtual colonoscopy is not designed to detect diminutive polyps
(i.e., less than or equal to 5 mm), the presence or absence of which
may not affect clinical management.

CT ABDOMEN AND PELVIS WITHOUT CONTRAST

Lower chest: Lung bases are clear. No effusions. Heart is normal
size.

Hepatobiliary: No focal hepatic abnormality. Gallbladder
unremarkable.

Pancreas: No focal abnormality or ductal dilatation.

Spleen: No focal abnormality.  Normal size.

Adrenals/Urinary Tract: No adrenal abnormality. No focal renal
abnormality. No stones or hydronephrosis. Urinary bladder is
unremarkable.

Stomach/Bowel: Stomach and small bowel grossly unremarkable.

Vascular/Lymphatic: No evidence of aneurysm or adenopathy.

Reproductive: Uterus and adnexa unremarkable.  No mass.

Other: No free fluid or free air.

Musculoskeletal: No acute bony abnormality. Degenerative changes in
the lumbar spine.
IMPRESSION: No suspicious persistent non mobile polypoid filling defects or
annular constricting lesions. Mild tortuosity of the colon.
Scattered left colonic diverticulosis.

No acute or significant extracardiac abnormality.

## 2023-03-26 ENCOUNTER — Encounter
Admission: RE | Disposition: A | Discharge status: Home / Self Care | Payer: Self-pay | Source: Ambulatory Visit | Attending: Gastroenterology

## 2023-03-26 ENCOUNTER — Encounter: Payer: Self-pay | Admitting: *Deleted

## 2023-03-26 ENCOUNTER — Ambulatory Visit
Admission: RE | Admit: 2023-03-26 | Discharge: 2023-03-26 | Disposition: A | Discharge status: Home / Self Care | Payer: Medicare Other | Source: Ambulatory Visit | Attending: Gastroenterology | Admitting: Gastroenterology

## 2023-03-26 ENCOUNTER — Ambulatory Visit: Payer: Medicare Other | Admitting: Certified Registered"

## 2023-03-26 DIAGNOSIS — Z1211 Encounter for screening for malignant neoplasm of colon: Secondary | ICD-10-CM | POA: Insufficient documentation

## 2023-03-26 DIAGNOSIS — J45909 Unspecified asthma, uncomplicated: Secondary | ICD-10-CM | POA: Diagnosis not present

## 2023-03-26 DIAGNOSIS — K640 First degree hemorrhoids: Secondary | ICD-10-CM | POA: Diagnosis not present

## 2023-03-26 DIAGNOSIS — K5730 Diverticulosis of large intestine without perforation or abscess without bleeding: Secondary | ICD-10-CM | POA: Diagnosis not present

## 2023-03-26 DIAGNOSIS — Z8719 Personal history of other diseases of the digestive system: Secondary | ICD-10-CM | POA: Diagnosis present

## 2023-03-26 DIAGNOSIS — K219 Gastro-esophageal reflux disease without esophagitis: Secondary | ICD-10-CM | POA: Insufficient documentation

## 2023-03-26 HISTORY — PX: COLONOSCOPY: SHX5424

## 2023-03-26 SURGERY — COLONOSCOPY
Anesthesia: General

## 2023-03-26 MED ORDER — ONDANSETRON HCL 4 MG/2ML IJ SOLN
INTRAMUSCULAR | Status: DC | PRN
  Administered 2023-03-26: 4 mg via INTRAVENOUS

## 2023-03-26 MED ORDER — PROPOFOL 500 MG/50ML IV EMUL
INTRAVENOUS | Status: DC | PRN
  Administered 2023-03-26: 50 mg via INTRAVENOUS
  Administered 2023-03-26: 150 ug/kg/min via INTRAVENOUS

## 2023-03-26 MED ORDER — SODIUM CHLORIDE 0.9 % IV SOLN: INTRAVENOUS | Status: DC

## 2023-03-26 NOTE — Interval H&P Note (Signed)
History and Physical Interval Note:  03/26/2023 8:23 AM  Amy Barton  has presented today for surgery, with the diagnosis of HX OF ADENOMATOUS POLYP OF COLON,.  The various methods of treatment have been discussed with the patient and family. After consideration of risks, benefits and other options for treatment, the patient has consented to  Procedure(s): COLONOSCOPY (N/A) as a surgical intervention.  The patient's history has been reviewed, patient examined, no change in status, stable for surgery.  I have reviewed the patient's chart and labs.  Questions were answered to the patient's satisfaction.     Regis Bill  Ok to proceed with colonoscopy

## 2023-03-26 NOTE — Anesthesia Preprocedure Evaluation (Signed)
Anesthesia Evaluation  Patient identified by MRN, date of birth, ID band Patient awake    Reviewed: Allergy & Precautions, H&P , NPO status , Patient's Chart, lab work & pertinent test results, reviewed documented beta blocker date and time   History of Anesthesia Complications Negative for: history of anesthetic complications  Airway Mallampati: I  TM Distance: >3 FB Neck ROM: full    Dental  (+) Missing, Dental Advidsory Given, Teeth Intact   Pulmonary neg shortness of breath, asthma , neg sleep apnea, neg COPD, neg recent URI          Cardiovascular Exercise Tolerance: Good negative cardio ROS      Neuro/Psych negative neurological ROS  negative psych ROS   GI/Hepatic Neg liver ROS,GERD  ,,  Endo/Other  negative endocrine ROS    Renal/GU negative Renal ROS  negative genitourinary   Musculoskeletal   Abdominal   Peds  Hematology negative hematology ROS (+)   Anesthesia Other Findings Past Medical History: No date: Allergic rhinitis No date: DDD (degenerative disc disease), lumbar     Comment:  L3 - L4  (epidurial injection)  intermittant leg               numbness No date: Environmental and seasonal allergies No date: GERD (gastroesophageal reflux disease) No date: Pelvic pain in female No date: SUI (stress urinary incontinence, female) No date: Urethral stenosis   Reproductive/Obstetrics negative OB ROS                             Anesthesia Physical Anesthesia Plan  ASA: 2  Anesthesia Plan: General   Post-op Pain Management:    Induction: Intravenous  PONV Risk Score and Plan: 3 and Propofol infusion and TIVA  Airway Management Planned: Nasal Cannula and Natural Airway  Additional Equipment:   Intra-op Plan:   Post-operative Plan:   Informed Consent: I have reviewed the patients History and Physical, chart, labs and discussed the procedure including the risks,  benefits and alternatives for the proposed anesthesia with the patient or authorized representative who has indicated his/her understanding and acceptance.     Dental Advisory Given  Plan Discussed with: Anesthesiologist, CRNA and Surgeon  Anesthesia Plan Comments:         Anesthesia Quick Evaluation

## 2023-03-26 NOTE — H&P (Signed)
Outpatient short stay form Pre-procedure 03/26/2023  Regis Bill, MD  Primary Physician: Kandyce Rud, MD  Reason for visit:  Surveillance colonoscopy  History of present illness:    72 y/o lady with history of GERD and arthritis here for surveillance colonoscopy. Last colonoscopy in 2019 was unable to be completed due to tortuous colon. No blood thinners. No significant abdominal surgeries. No known first degree relatives with colon cancer.    Current Facility-Administered Medications:    0.9 %  sodium chloride infusion, , Intravenous, Continuous, Kimsey Demaree, Rossie Muskrat, MD, Last Rate: 20 mL/hr at 03/26/23 0817, Restarted at 03/26/23 0819  Medications Prior to Admission  Medication Sig Dispense Refill Last Dose   ACYCLOVIR PO Take 1 tablet by mouth as needed (TAKE UP TO FIVE DAYS WITH BREAKOUT OF FEVER BLISTERS).      albuterol (VENTOLIN HFA) 108 (90 Base) MCG/ACT inhaler Inhale 1-2 puffs into the lungs every 6 (six) hours as needed for wheezing or shortness of breath. 18 g 1    aspirin EC 81 MG tablet Take 81 mg by mouth daily.      celecoxib (CELEBREX) 200 MG capsule Take 200 mg by mouth every evening.      Cholecalciferol (VITAMIN D3) 2000 UNITS TABS Take 1 capsule by mouth daily.      citalopram (CELEXA) 20 MG tablet Take 20 mg by mouth every evening.      dexlansoprazole (DEXILANT) 60 MG capsule Take by mouth.      diphenhydrAMINE (BENADRYL) 50 MG capsule Take 50 mg by mouth every evening. Reported on 07/07/2015      estradiol (VIVELLE-DOT) 0.05 MG/24HR patch Place 1 patch onto the skin 2 (two) times a week.      fesoterodine (TOVIAZ) 4 MG TB24 tablet Take 4 mg by mouth every evening.      fluticasone (FLONASE) 50 MCG/ACT nasal spray Place 1 spray into both nostrils 2 (two) times daily. 16 g 5    Meth-Hyo-M Bl-Na Phos-Ph Sal (URIBEL) 118 MG CAPS Take 1 capsule by mouth 4 (four) times daily as needed.      montelukast (SINGULAIR) 10 MG tablet Take 10 mg by mouth at bedtime.    03/24/2023   pantoprazole (PROTONIX) 40 MG tablet Take 40 mg by mouth every morning. Reported on 07/07/2015   03/24/2023   pentosan polysulfate (ELMIRON) 100 MG capsule Take 100 mg by mouth 3 (three) times daily.      simvastatin (ZOCOR) 10 MG tablet Take by mouth.   03/24/2023   solifenacin (VESICARE) 10 MG tablet Take 5 mg by mouth daily.   03/24/2023   SUMAtriptan (IMITREX) 20 MG/ACT nasal spray One spray as directed.  May take a second dose after 2 hours if needed.      Turmeric 500 MG CAPS Take 1 capsule by mouth daily.      valACYclovir (VALTREX) 1000 MG tablet Take by mouth.        Allergies  Allergen Reactions   Allegra-D  [Fexofenadine-Pseudoephed Er] Nausea And Vomiting   Antihistamines, Chlorpheniramine-Type Other (See Comments)   Claritin-D 12 Hour [Loratadine-Pseudoephedrine Er] Other (See Comments)    "DIZZINESS"   Chlorhexidine Gluconate Rash    Rash after using wipes pre operatively      Past Medical History:  Diagnosis Date   Allergic rhinitis    DDD (degenerative disc disease), lumbar    L3 - L4  (epidurial injection)  intermittant leg numbness   Environmental and seasonal allergies    GERD (gastroesophageal reflux  disease)    Pelvic pain in female    SUI (stress urinary incontinence, female)    Urethral stenosis     Review of systems:  Otherwise negative.    Physical Exam  Gen: Alert, oriented. Appears stated age.  HEENT: PERRLA. Lungs: No respiratory distress CV: RRR Abd: soft, benign, no masses Ext: No edema    Planned procedures: Proceed with colonoscopy. The patient understands the nature of the planned procedure, indications, risks, alternatives and potential complications including but not limited to bleeding, infection, perforation, damage to internal organs and possible oversedation/side effects from anesthesia. The patient agrees and gives consent to proceed.  Please refer to procedure notes for findings, recommendations and patient  disposition/instructions.     Regis Bill, MD [facility]

## 2023-03-26 NOTE — Op Note (Signed)
[facility] Patient Name: Amy Barton Procedure Date: 03/26/2023 8:16 AM MRN: [id number] Account #: [id number] Date of Birth: 02-20-1951 Admit Type: Outpatient Age: 72 Room: [facility] ENDO ROOM 1 Gender: Female Note Status: Finalized Instrument Name: Peds Colonoscope [id number] Procedure:             Colonoscopy Indications:           High risk colon cancer surveillance: Personal history                         of colonic polyps, Last colonoscopy 5 years ago Providers:             Eather Colas MD, MD Referring MD:          Hassell Halim MD (Referring MD) Medicines:             Monitored Anesthesia Care Complications:         No immediate complications. Procedure:             Pre-Anesthesia Assessment:                        - Prior to the procedure, a History and Physical was                         performed, and patient medications and allergies were                         reviewed. The patient is competent. The risks and                         benefits of the procedure and the sedation options and                         risks were discussed with the patient. All questions                         were answered and informed consent was obtained.                         Patient identification and proposed procedure were                         verified by the physician, the nurse, the                         anesthesiologist, the anesthetist and the technician                         in the endoscopy suite. Mental Status Examination:                         alert and oriented. Airway Examination: normal                         oropharyngeal airway and neck mobility. Respiratory                         Examination: clear to auscultation. CV Examination:  normal. Prophylactic Antibiotics: The patient does not                         require prophylactic antibiotics. Prior                         Anticoagulants: The  patient has taken no anticoagulant                         or antiplatelet agents. ASA Grade Assessment: II - A                         patient with mild systemic disease. After reviewing                         the risks and benefits, the patient was deemed in                         satisfactory condition to undergo the procedure. The                         anesthesia plan was to use monitored anesthesia care                         (MAC). Immediately prior to administration of                         medications, the patient was re-assessed for adequacy                         to receive sedatives. The heart rate, respiratory                         rate, oxygen saturations, blood pressure, adequacy of                         pulmonary ventilation, and response to care were                         monitored throughout the procedure. The physical                         status of the patient was re-assessed after the                         procedure.                        After obtaining informed consent, the colonoscope was                         passed under direct vision. Throughout the procedure,                         the patient's blood pressure, pulse, and oxygen                         saturations were monitored continuously. The  Colonoscope was introduced through the anus with the                         intention of advancing to the cecum. The scope was                         advanced to the transverse colon before the procedure                         was aborted. Medications were given. The colonoscopy                         was aborted due to restricted mobility of the colon.                         Withdrawing the scope and replacing with the adult                         endoscope did not allow for the successful completion                         of the procedure. Findings:      The perianal and digital rectal examinations were normal.       Multiple small-mouthed diverticula were found in the sigmoid colon.       There was an area of significant resistance likely due to multiple       diverticula in the area. This area was able to be passed with EGD scope       but could only make it to proximal transverse colon with EGD scope.      Internal hemorrhoids were found during retroflexion. The hemorrhoids       were Grade I (internal hemorrhoids that do not prolapse). Impression:            - The procedure was aborted due to restricted mobility                         of the colon.                        - Diverticulosis in the sigmoid colon.                        - Internal hemorrhoids.                        - No specimens collected. Recommendation:        - Discharge patient to home.                        - Resume previous diet.                        - Continue present medications.                        - Perform a virtual colonoscopy at appointment to be                         scheduled. Procedure Code(s):     ---  Professional ---                        L2440, 53, Colorectal cancer screening; colonoscopy on                         individual at high risk Diagnosis Code(s):     --- Professional ---                        Z86.010, Personal history of colonic polyps                        K64.0, First degree hemorrhoids                        K57.30, Diverticulosis of large intestine without                         perforation or abscess without bleeding CPT copyright 2022 [employer]. All rights reserved. The codes documented in this report are preliminary and upon coder review may  be revised to meet current compliance requirements. Eather Colas MD, MD 03/26/2023 9:00:16 AM Number of Addenda: 0 Note Initiated On: 03/26/2023 8:16 AM Total Procedure Duration: 0 hours 18 minutes 49 seconds  Estimated Blood Loss:  Estimated blood loss: none.      [facility]

## 2023-03-26 NOTE — Transfer of Care (Signed)
Immediate Anesthesia Transfer of Care Note  Patient: Amy Barton  Procedure(s) Performed: COLONOSCOPY  Patient Location: PACU  Anesthesia Type:MAC  Level of Consciousness: drowsy  Airway & Oxygen Therapy: Patient Spontanous Breathing and Patient connected to face mask oxygen  Post-op Assessment: Report given to RN and Post -op Vital signs reviewed and stable  Post vital signs: Reviewed  Last Vitals:  Vitals Value Taken Time  BP 124/78 03/26/23 0857  Temp 36.4 C 03/26/23 0857  Pulse 81 03/26/23 0858  Resp 24 03/26/23 0858  SpO2 97 % 03/26/23 0858  Vitals shown include unfiled device data.  Last Pain:  Vitals:   03/26/23 0857  TempSrc: Temporal  PainSc: Asleep         Complications: No notable events documented.

## 2023-03-27 ENCOUNTER — Encounter: Payer: Self-pay | Admitting: Gastroenterology

## 2023-04-02 ENCOUNTER — Other Ambulatory Visit: Payer: Self-pay | Admitting: Gastroenterology

## 2023-04-02 DIAGNOSIS — Z860100 Personal history of colon polyps, unspecified: Secondary | ICD-10-CM

## 2023-04-13 NOTE — Anesthesia Postprocedure Evaluation (Signed)
Anesthesia Post Note  Patient: Amy Barton  Procedure(s) Performed: COLONOSCOPY  Patient location during evaluation: Endoscopy Anesthesia Type: General Level of consciousness: awake and alert Pain management: pain level controlled Vital Signs Assessment: post-procedure vital signs reviewed and stable Respiratory status: spontaneous breathing, nonlabored ventilation, respiratory function stable and patient connected to nasal cannula oxygen Cardiovascular status: blood pressure returned to baseline and stable Postop Assessment: no apparent nausea or vomiting Anesthetic complications: no   No notable events documented.   Last Vitals:  Vitals:   03/26/23 0804 03/26/23 0857  BP: (!) 157/92 124/78  Pulse: 88   Resp: 18   Temp: (!) 36.3 C (!) 36.4 C  SpO2: 100%     Last Pain:  Vitals:   03/27/23 0749  TempSrc:   PainSc: 0-No pain                 Lenard Simmer

## 2023-06-04 ENCOUNTER — Other Ambulatory Visit: Payer: Medicare Other

## 2023-11-21 ENCOUNTER — Other Ambulatory Visit: Payer: Self-pay | Admitting: Family Medicine

## 2023-11-21 DIAGNOSIS — Z1231 Encounter for screening mammogram for malignant neoplasm of breast: Secondary | ICD-10-CM

## 2023-12-05 ENCOUNTER — Ambulatory Visit
Admission: RE | Admit: 2023-12-05 | Discharge: 2023-12-05 | Disposition: A | Discharge status: Home / Self Care | Source: Ambulatory Visit | Attending: Family Medicine | Admitting: Family Medicine

## 2023-12-05 DIAGNOSIS — Z1231 Encounter for screening mammogram for malignant neoplasm of breast: Secondary | ICD-10-CM | POA: Insufficient documentation

## 2024-05-25 ENCOUNTER — Other Ambulatory Visit: Payer: Self-pay | Admitting: Neurology

## 2024-05-25 DIAGNOSIS — R413 Other amnesia: Secondary | ICD-10-CM

## 2024-05-25 DIAGNOSIS — F0280 Dementia in other diseases classified elsewhere without behavioral disturbance: Secondary | ICD-10-CM

## 2024-05-29 ENCOUNTER — Ambulatory Visit
Admission: RE | Admit: 2024-05-29 | Discharge: 2024-05-29 | Disposition: A | Discharge status: Home / Self Care | Source: Ambulatory Visit | Attending: Neurology | Admitting: Neurology

## 2024-05-29 DIAGNOSIS — R413 Other amnesia: Secondary | ICD-10-CM | POA: Diagnosis present

## 2024-05-29 DIAGNOSIS — F0280 Dementia in other diseases classified elsewhere without behavioral disturbance: Secondary | ICD-10-CM | POA: Insufficient documentation

## 2024-05-29 DIAGNOSIS — G309 Alzheimer's disease, unspecified: Secondary | ICD-10-CM | POA: Insufficient documentation
